# Patient Record
Sex: Male | Born: 1955 | Race: White | Hispanic: No | Marital: Single | Smoking: Never smoker
Health system: Southern US, Community
[De-identification: ages and names within clinical notes are randomized; demographics above are authoritative.]

## PROBLEM LIST (undated history)

## (undated) DIAGNOSIS — E785 Hyperlipidemia, unspecified: Secondary | ICD-10-CM

## (undated) DIAGNOSIS — R030 Elevated blood-pressure reading, without diagnosis of hypertension: Secondary | ICD-10-CM

## (undated) DIAGNOSIS — I214 Non-ST elevation (NSTEMI) myocardial infarction: Secondary | ICD-10-CM

## (undated) HISTORY — PX: TOOTH EXTRACTION: SUR596

## (undated) HISTORY — PX: POLYPECTOMY: SHX149

## (undated) HISTORY — PX: COLONOSCOPY: SHX174

## (undated) HISTORY — DX: Hyperlipidemia, unspecified: E78.5

---

## 1980-11-06 HISTORY — PX: WISDOM TOOTH EXTRACTION: SHX21

## 2002-03-09 ENCOUNTER — Emergency Department (HOSPITAL_COMMUNITY): Admission: EM | Admit: 2002-03-09 | Discharge: 2002-03-10 | Payer: Self-pay | Admitting: *Deleted

## 2006-06-19 ENCOUNTER — Ambulatory Visit: Payer: Self-pay | Admitting: Gastroenterology

## 2006-07-03 ENCOUNTER — Ambulatory Visit: Payer: Self-pay | Admitting: Gastroenterology

## 2011-07-13 ENCOUNTER — Encounter: Payer: Self-pay | Admitting: Internal Medicine

## 2012-01-04 ENCOUNTER — Encounter: Payer: Self-pay | Admitting: Internal Medicine

## 2012-02-15 ENCOUNTER — Encounter: Payer: Self-pay | Admitting: Internal Medicine

## 2012-02-15 ENCOUNTER — Ambulatory Visit (AMBULATORY_SURGERY_CENTER): Payer: BC Managed Care – PPO | Admitting: *Deleted

## 2012-02-15 VITALS — Ht 66.0 in | Wt 125.5 lb

## 2012-02-15 DIAGNOSIS — Z1211 Encounter for screening for malignant neoplasm of colon: Secondary | ICD-10-CM

## 2012-02-15 MED ORDER — PEG-KCL-NACL-NASULF-NA ASC-C 100 G PO SOLR: ORAL | Status: DC

## 2012-02-29 ENCOUNTER — Encounter: Payer: Self-pay | Admitting: Internal Medicine

## 2012-02-29 ENCOUNTER — Ambulatory Visit (AMBULATORY_SURGERY_CENTER): Payer: BC Managed Care – PPO | Admitting: Internal Medicine

## 2012-02-29 VITALS — BP 118/70 | HR 70 | Temp 96.8°F | Resp 20 | Ht 66.0 in | Wt 125.0 lb

## 2012-02-29 DIAGNOSIS — Z1211 Encounter for screening for malignant neoplasm of colon: Secondary | ICD-10-CM

## 2012-02-29 DIAGNOSIS — D126 Benign neoplasm of colon, unspecified: Secondary | ICD-10-CM

## 2012-02-29 DIAGNOSIS — Z86010 Personal history of colonic polyps: Secondary | ICD-10-CM

## 2012-02-29 MED ORDER — SODIUM CHLORIDE 0.9 % IV SOLN: 500.0000 mL | INTRAVENOUS | Status: DC

## 2012-02-29 NOTE — Op Note (Signed)
[facility] [address] [place], [place]  [address]  COLONOSCOPY PROCEDURE REPORT  PATIENT:  Thomas, Kelley  MR#:  [id number] BIRTHDATE:  12-04-1955, 55 yrs. old  GENDER:  male ENDOSCOPIST:  Wilhemina Bonito. Eda Keys, MD REF. BY:  Screening Recall PROCEDURE DATE:  02/29/2012 PROCEDURE:  Colonoscopy with snare polypectomy X 1 ASA CLASS:  Class II INDICATIONS:  history of polyps, surveillance and high-risk screening ; index exam 2003 [facility]) nonadenomatous polyp. f/u 2007 - no polyps. PATIENT CLARIFIED FAMILY HX. NO RELATIVE WITH COLON CANCER MEDICATIONS:   MAC sedation, administered by CRNA, propofol (Diprivan) 250 mg IV  DESCRIPTION OF PROCEDURE:   After the risks benefits and alternatives of the procedure were thoroughly explained, informed consent was obtained.  Digital rectal exam was performed and revealed no abnormalities.   The LB CF-[id number] endoscope was introduced through the anus and advanced to the cecum, which was identified by both the appendix and ileocecal valve, without limitations.  The quality of the prep was excellent, using MoviPrep.  The instrument was then slowly withdrawn as the colon was fully examined. <<PROCEDUREIMAGES>>  FINDINGS: A diminutive polyp was found in the rectum and snared without cautery.Retrieval was successful. Otherwise normal colonoscopy without other polyps, masses, vascular ectasias, or inflammatory changes.   Retroflexed views in the rectum revealed no abnormalities.  The time to cecum = 3:42 minutes. The scope was then withdrawn in 10:05 minutes from the cecum and the procedure completed.  COMPLICATIONS:  None  ENDOSCOPIC IMPRESSION: 1) Diminutive polyp in the rectum - removed 2) Otherwise normal colonoscopy  RECOMMENDATIONS: 1) Repeat colonoscopy in 5 years if polyp adenomatous; otherwise 10 years  ______________________________ Wilhemina Bonito. Eda Keys, MD  CC:  Thomas Apley, MD;  The Patient  n. eSIGNED:   Wilhemina Bonito.  Eda Keys at 02/29/2012 03:00 PM  Jocelyn Lamer, [id number]

## 2012-02-29 NOTE — Progress Notes (Signed)
Propofol administered by S Camp CRNA 

## 2012-02-29 NOTE — Patient Instructions (Signed)
Discharge instructions given with verbal understanding. Handout on polyps given. Resume previous medications. YOU HAD AN ENDOSCOPIC PROCEDURE TODAY AT THE [facility]: Refer to the procedure report that was given to you for any specific questions about what was found during the examination.  If the procedure report does not answer your questions, please call your gastroenterologist to clarify.  If you requested that your care partner not be given the details of your procedure findings, then the procedure report has been included in a sealed envelope for you to review at your convenience later.  YOU SHOULD EXPECT: Some feelings of bloating in the abdomen. Passage of more gas than usual.  Walking can help get rid of the air that was put into your GI tract during the procedure and reduce the bloating. If you had a lower endoscopy (such as a colonoscopy or flexible sigmoidoscopy) you may notice spotting of blood in your stool or on the toilet paper. If you underwent a bowel prep for your procedure, then you may not have a normal bowel movement for a few days.  DIET: Your first meal following the procedure should be a light meal and then it is ok to progress to your normal diet.  A half-sandwich or bowl of soup is an example of a good first meal.  Heavy or fried foods are harder to digest and may make you feel nauseous or bloated.  Likewise meals heavy in dairy and vegetables can cause extra gas to form and this can also increase the bloating.  Drink plenty of fluids but you should avoid alcoholic beverages for 24 hours.  ACTIVITY: Your care partner should take you home directly after the procedure.  You should plan to take it easy, moving slowly for the rest of the day.  You can resume normal activity the day after the procedure however you should NOT DRIVE or use heavy machinery for 24 hours (because of the sedation medicines used during the test).    SYMPTOMS TO REPORT IMMEDIATELY: A  gastroenterologist can be reached at any hour.  During normal business hours, 8:30 AM to 5:00 PM Monday through Friday, call [phone].  After hours and on weekends, please call the GI answering service at [phone] who will take a message and have the physician on call contact you.   Following lower endoscopy (colonoscopy or flexible sigmoidoscopy):  Excessive amounts of blood in the stool  Significant tenderness or worsening of abdominal pains  Swelling of the abdomen that is new, acute  Fever of 100F or higher  FOLLOW UP: If any biopsies were taken you will be contacted by phone or by letter within the next 1-3 weeks.  Call your gastroenterologist if you have not heard about the biopsies in 3 weeks.  Our staff will call the home number listed on your records the next business day following your procedure to check on you and address any questions or concerns that you may have at that time regarding the information given to you following your procedure. This is a courtesy call and so if there is no answer at the home number and we have not heard from you through the emergency physician on call, we will assume that you have returned to your regular daily activities without incident.  SIGNATURES/CONFIDENTIALITY: You and/or your care partner have signed paperwork which will be entered into your electronic medical record.  These signatures attest to the fact that that the information above on your After Visit Summary has   been reviewed and is understood.  Full responsibility of the confidentiality of this discharge information lies with you and/or your care-partner. 

## 2012-02-29 NOTE — Progress Notes (Signed)
Patient did not experience any of the following events: a burn prior to discharge; a fall within the facility; wrong site/side/patient/procedure/implant event; or a hospital transfer or hospital admission upon discharge from the facility. (G8907) Patient did not have preoperative order for IV antibiotic SSI prophylaxis. (G8918)  

## 2012-03-01 ENCOUNTER — Telehealth: Payer: Self-pay

## 2012-03-01 NOTE — Telephone Encounter (Signed)
Left message on answering machine. 

## 2012-03-05 ENCOUNTER — Encounter: Payer: Self-pay | Admitting: Internal Medicine

## 2013-07-21 ENCOUNTER — Ambulatory Visit
Admission: RE | Admit: 2013-07-21 | Discharge: 2013-07-21 | Disposition: A | Discharge status: Home / Self Care | Payer: BC Managed Care – PPO | Source: Ambulatory Visit | Attending: Internal Medicine | Admitting: Internal Medicine

## 2013-07-21 ENCOUNTER — Other Ambulatory Visit: Payer: Self-pay | Admitting: Internal Medicine

## 2013-07-21 DIAGNOSIS — M79644 Pain in right finger(s): Secondary | ICD-10-CM

## 2014-01-15 ENCOUNTER — Ambulatory Visit
Admission: RE | Admit: 2014-01-15 | Discharge: 2014-01-15 | Disposition: A | Discharge status: Home / Self Care | Payer: BC Managed Care – PPO | Source: Ambulatory Visit | Attending: Internal Medicine | Admitting: Internal Medicine

## 2014-01-15 ENCOUNTER — Other Ambulatory Visit: Payer: Self-pay | Admitting: Internal Medicine

## 2014-01-15 DIAGNOSIS — M542 Cervicalgia: Secondary | ICD-10-CM

## 2015-10-19 ENCOUNTER — Encounter: Payer: Self-pay | Admitting: Gastroenterology

## 2016-12-29 ENCOUNTER — Encounter: Payer: Self-pay | Admitting: Internal Medicine

## 2017-08-10 ENCOUNTER — Encounter: Payer: Self-pay | Admitting: Internal Medicine

## 2017-09-21 ENCOUNTER — Other Ambulatory Visit: Payer: Self-pay

## 2017-09-21 ENCOUNTER — Ambulatory Visit (AMBULATORY_SURGERY_CENTER): Payer: Self-pay | Admitting: *Deleted

## 2017-09-21 VITALS — Ht 66.0 in | Wt 122.8 lb

## 2017-09-21 DIAGNOSIS — Z860100 Personal history of colon polyps, unspecified: Secondary | ICD-10-CM

## 2017-09-21 MED ORDER — NA SULFATE-K SULFATE-MG SULF 17.5-3.13-1.6 GM/177ML PO SOLN: 1.0000 [IU] | Freq: Once | ORAL | 0 refills | Status: AC

## 2017-09-21 NOTE — Progress Notes (Signed)
No egg or soy allergy known to patient  No issues with past sedation with any surgeries  or procedures, no intubation problems  No diet pills per patient No home 02 use per patient  No blood thinners per patient  Pt denies issues with constipation  No A fib or A flutter  EMMI video sent to pt's e mail  

## 2017-10-12 ENCOUNTER — Other Ambulatory Visit: Payer: Self-pay

## 2017-10-12 ENCOUNTER — Encounter: Payer: Self-pay | Admitting: Internal Medicine

## 2017-10-12 ENCOUNTER — Ambulatory Visit (AMBULATORY_SURGERY_CENTER): Payer: BLUE CROSS/BLUE SHIELD | Admitting: Internal Medicine

## 2017-10-12 VITALS — BP 99/60 | HR 58 | Temp 97.3°F | Resp 11 | Ht 66.0 in | Wt 122.0 lb

## 2017-10-12 DIAGNOSIS — Z86010 Personal history of colonic polyps: Secondary | ICD-10-CM

## 2017-10-12 MED ORDER — SODIUM CHLORIDE 0.9 % IV SOLN
500.0000 mL | Freq: Once | INTRAVENOUS | Status: DC
Start: 2017-10-12 — End: 2017-10-12

## 2017-10-12 NOTE — Patient Instructions (Signed)
Impression/Recommendations:  Diverticulosis handout given to patient. Hemorrhoid handout given to patient.  Repeat colonoscopy in 10 years for surveillance.  Resume previous diet. Continue present medications.  YOU HAD AN ENDOSCOPIC PROCEDURE TODAY AT [facility]:   Refer to the procedure report that was given to you for any specific questions about what was found during the examination.  If the procedure report does not answer your questions, please call your gastroenterologist to clarify.  If you requested that your care partner not be given the details of your procedure findings, then the procedure report has been included in a sealed envelope for you to review at your convenience later.  YOU SHOULD EXPECT: Some feelings of bloating in the abdomen. Passage of more gas than usual.  Walking can help get rid of the air that was put into your GI tract during the procedure and reduce the bloating. If you had a lower endoscopy (such as a colonoscopy or flexible sigmoidoscopy) you may notice spotting of blood in your stool or on the toilet paper. If you underwent a bowel prep for your procedure, you may not have a normal bowel movement for a few days.  Please Note:  You might notice some irritation and congestion in your nose or some drainage.  This is from the oxygen used during your procedure.  There is no need for concern and it should clear up in a day or so.  SYMPTOMS TO REPORT IMMEDIATELY:   Following lower endoscopy (colonoscopy or flexible sigmoidoscopy):  Excessive amounts of blood in the stool  Significant tenderness or worsening of abdominal pains  Swelling of the abdomen that is new, acute  Fever of 100F or higher For urgent or emergent issues, a gastroenterologist can be reached at any hour by calling [phone].   DIET:  We do recommend a small meal at first, but then you may proceed to your regular diet.  Drink plenty of fluids but you should avoid  alcoholic beverages for 24 hours.  ACTIVITY:  You should plan to take it easy for the rest of today and you should NOT DRIVE or use heavy machinery until tomorrow (because of the sedation medicines used during the test).    FOLLOW UP: Our staff will call the number listed on your records the next business day following your procedure to check on you and address any questions or concerns that you may have regarding the information given to you following your procedure. If we do not reach you, we will leave a message.  However, if you are feeling well and you are not experiencing any problems, there is no need to return our call.  We will assume that you have returned to your regular daily activities without incident.  If any biopsies were taken you will be contacted by phone or by letter within the next 1-3 weeks.  Please call us at [phone] if you have not heard about the biopsies in 3 weeks.    SIGNATURES/CONFIDENTIALITY: You and/or your care partner have signed paperwork which will be entered into your electronic medical record.  These signatures attest to the fact that that the information above on your After Visit Summary has been reviewed and is understood.  Full responsibility of the confidentiality of this discharge information lies with you and/or your care-partner.

## 2017-10-12 NOTE — Progress Notes (Signed)
Pt's states no medical or surgical changes since previsit or office visit. 

## 2017-10-12 NOTE — Op Note (Signed)
[place] [facility] Patient Name: Thomas Kelley Procedure Date: 10/12/2017 7:12 AM MRN: [id number] Endoscopist: Docia Chuck. Henrene Pastor , MD Age: 61 Referring MD:  Date of Birth: [birth year] Gender: Male Account #: [id number] Procedure:                Colonoscopy Indications:              High risk colon cancer surveillance: Personal                            history of non-advanced adenoma. Previous                            examinations 2003, 2007, 2013 Medicines:                Monitored Anesthesia Care Procedure:                Pre-Anesthesia Assessment:                           - Prior to the procedure, a History and Physical                            was performed, and patient medications and                            allergies were reviewed. The patient's tolerance of                            previous anesthesia was also reviewed. The risks                            and benefits of the procedure and the sedation                            options and risks were discussed with the patient.                            All questions were answered, and informed consent                            was obtained. Prior Anticoagulants: The patient has                            taken no previous anticoagulant or antiplatelet                            agents. ASA Grade Assessment: I - A normal, healthy                            patient. After reviewing the risks and benefits,                            the patient was deemed in satisfactory condition to  undergo the procedure.                           After obtaining informed consent, the colonoscope                            was passed under direct vision. Throughout the                            procedure, the patient's blood pressure, pulse, and                            oxygen saturations were monitored continuously. The                            Colonoscope was introduced through the anus and                  advanced to the the cecum, identified by                            appendiceal orifice and ileocecal valve. The                            ileocecal valve, appendiceal orifice, and rectum                            were photographed. The quality of the bowel                            preparation was excellent. The colonoscopy was                            performed without difficulty. The patient tolerated                            the procedure well. The bowel preparation used was                            SUPREP. Scope In: 8:19:00 AM Scope Out: 8:35:42 AM Scope Withdrawal Time: 0 hours 12 minutes 3 seconds  Total Procedure Duration: 0 hours 16 minutes 42 seconds  Findings:                 A few small-mouthed diverticula were found in the                            sigmoid colon.                           Internal hemorrhoids were found during retroflexion.                           The exam was otherwise without abnormality on                            direct and retroflexion  views. Complications:            No immediate complications. Estimated blood loss:                            None. Estimated Blood Loss:     Estimated blood loss: none. Impression:               - Diverticulosis in the sigmoid colon.                           - Internal hemorrhoids.                           - The examination was otherwise normal on direct                            and retroflexion views.                           - No specimens collected. Recommendation:           - Repeat colonoscopy in 10 years for surveillance.                           - Patient has a contact number available for                            emergencies. The signs and symptoms of potential                            delayed complications were discussed with the                            patient. Return to normal activities tomorrow.                            Written discharge instructions were provided  to the                            patient.                           - Resume previous diet.                           - Continue present medications. Docia Chuck. Henrene Pastor, MD 10/12/2017 8:44:29 AM This report has been signed electronically.

## 2017-10-12 NOTE — Progress Notes (Signed)
A/ox3 pleased with MAC, report to RN 

## 2017-10-17 ENCOUNTER — Telehealth: Payer: Self-pay

## 2017-10-17 NOTE — Telephone Encounter (Signed)
  Follow up Call-  Call back number 10/12/2017  Post procedure Call Back phone  # [phone]  Permission to leave phone message Yes  Some recent data might be hidden     Patient questions:  Do you have a fever, pain , or abdominal swelling? No. Pain Score  0 *  Have you tolerated food without any problems? Yes.    Have you been able to return to your normal activities? Yes.    Do you have any questions about your discharge instructions: Diet   No. Medications  No. Follow up visit  No.  Do you have questions or concerns about your Care? No.  Actions: * If pain score is 4 or above: No action needed, pain <4.

## 2019-01-06 ENCOUNTER — Encounter (HOSPITAL_COMMUNITY): Payer: Self-pay | Admitting: *Deleted

## 2019-01-06 ENCOUNTER — Inpatient Hospital Stay (HOSPITAL_COMMUNITY)
Admission: EM | Admit: 2019-01-06 | Discharge: 2019-01-08 | DRG: 282 | Disposition: A | Discharge status: Home / Self Care | Payer: BLUE CROSS/BLUE SHIELD | Source: Ambulatory Visit | Attending: Cardiology | Admitting: Cardiology

## 2019-01-06 ENCOUNTER — Emergency Department (HOSPITAL_COMMUNITY): Payer: BLUE CROSS/BLUE SHIELD

## 2019-01-06 ENCOUNTER — Other Ambulatory Visit: Payer: Self-pay

## 2019-01-06 DIAGNOSIS — E785 Hyperlipidemia, unspecified: Secondary | ICD-10-CM | POA: Diagnosis present

## 2019-01-06 DIAGNOSIS — R079 Chest pain, unspecified: Secondary | ICD-10-CM

## 2019-01-06 DIAGNOSIS — I214 Non-ST elevation (NSTEMI) myocardial infarction: Secondary | ICD-10-CM

## 2019-01-06 DIAGNOSIS — R030 Elevated blood-pressure reading, without diagnosis of hypertension: Secondary | ICD-10-CM

## 2019-01-06 DIAGNOSIS — R51 Headache: Secondary | ICD-10-CM | POA: Diagnosis present

## 2019-01-06 DIAGNOSIS — I201 Angina pectoris with documented spasm: Secondary | ICD-10-CM

## 2019-01-06 DIAGNOSIS — R110 Nausea: Secondary | ICD-10-CM | POA: Diagnosis present

## 2019-01-06 DIAGNOSIS — R519 Headache, unspecified: Secondary | ICD-10-CM

## 2019-01-06 HISTORY — DX: Non-ST elevation (NSTEMI) myocardial infarction: I21.4

## 2019-01-06 HISTORY — DX: Elevated blood-pressure reading, without diagnosis of hypertension: R03.0

## 2019-01-06 LAB — BASIC METABOLIC PANEL
Anion gap: 8 (ref 5–15)
BUN: 12 mg/dL (ref 8–23)
CO2: 25 mmol/L (ref 22–32)
CREATININE: 0.8 mg/dL (ref 0.61–1.24)
Calcium: 9.1 mg/dL (ref 8.9–10.3)
Chloride: 106 mmol/L (ref 98–111)
GFR calc Af Amer: >60 mL/min (ref >60)
GFR calc non Af Amer: >60 mL/min (ref >60)
Glucose, Bld: 116 mg/dL — ABNORMAL HIGH (ref 70–99)
Potassium: 3.8 mmol/L (ref 3.5–5.1)
Sodium: 139 mmol/L (ref 135–145)

## 2019-01-06 LAB — CBC WITH DIFFERENTIAL/PLATELET
ABS IMMATURE GRANULOCYTES: 0.02 10*3/uL (ref 0.00–0.07)
Basophils Absolute: 0.1 10*3/uL (ref 0.0–0.1)
Basophils Relative: 1 %
Eosinophils Absolute: 0.1 10*3/uL (ref 0.0–0.5)
Eosinophils Relative: 1 %
HCT: 42.2 % (ref 39.0–52.0)
HEMOGLOBIN: 14.4 g/dL (ref 13.0–17.0)
Immature Granulocytes: 0 %
Lymphocytes Relative: 13 %
Lymphs Abs: 1.2 10*3/uL (ref 0.7–4.0)
MCH: 32.4 pg (ref 26.0–34.0)
MCHC: 34.1 g/dL (ref 30.0–36.0)
MCV: 95 fL (ref 80.0–100.0)
MONO ABS: 0.7 10*3/uL (ref 0.1–1.0)
Monocytes Relative: 8 %
NEUTROS ABS: 7.4 10*3/uL (ref 1.7–7.7)
Neutrophils Relative %: 77 %
Platelets: 198 10*3/uL (ref 150–400)
RBC: 4.44 MIL/uL (ref 4.22–5.81)
RDW: 12.4 % (ref 11.5–15.5)
WBC: 9.6 10*3/uL (ref 4.0–10.5)
nRBC: 0 % (ref 0.0–0.2)

## 2019-01-06 LAB — I-STAT TROPONIN, ED: Troponin i, poc: 1.05 ng/mL (ref 0.00–0.08)

## 2019-01-06 LAB — APTT: aPTT: 29 seconds (ref 24–36)

## 2019-01-06 MED ORDER — SODIUM CHLORIDE 0.9 % IV SOLN
INTRAVENOUS | Status: DC
  Administered 2019-01-07: 10 mL/h via INTRAVENOUS

## 2019-01-06 MED ORDER — ROSUVASTATIN CALCIUM 20 MG PO TABS
40.0000 mg | ORAL_TABLET | Freq: Every day | ORAL | Status: DC
  Administered 2019-01-07: 40 mg via ORAL
  Filled 2019-01-06: qty 2

## 2019-01-06 MED ORDER — HEPARIN BOLUS VIA INFUSION
3250.0000 [IU] | Freq: Once | INTRAVENOUS | Status: AC
  Administered 2019-01-06: 3250 [IU] via INTRAVENOUS
  Filled 2019-01-06: qty 3250

## 2019-01-06 MED ORDER — ASPIRIN EC 81 MG PO TBEC
81.0000 mg | DELAYED_RELEASE_TABLET | Freq: Every day | ORAL | Status: DC
  Administered 2019-01-08: 81 mg via ORAL
  Filled 2019-01-06: qty 1

## 2019-01-06 MED ORDER — HEPARIN (PORCINE) 25000 UT/250ML-% IV SOLN
650.0000 [IU]/h | INTRAVENOUS | Status: DC
  Administered 2019-01-06: 650 [IU]/h via INTRAVENOUS
  Filled 2019-01-06: qty 250

## 2019-01-06 MED ORDER — ACETAMINOPHEN 325 MG PO TABS
650.0000 mg | ORAL_TABLET | ORAL | Status: DC | PRN
  Administered 2019-01-07: 650 mg via ORAL
  Filled 2019-01-06: qty 2

## 2019-01-06 MED ORDER — ONDANSETRON HCL 4 MG/2ML IJ SOLN
4.0000 mg | Freq: Four times a day (QID) | INTRAMUSCULAR | Status: DC | PRN
  Administered 2019-01-08: 4 mg via INTRAVENOUS
  Filled 2019-01-06: qty 2

## 2019-01-06 MED ORDER — LISINOPRIL 5 MG PO TABS: 5.0000 mg | ORAL_TABLET | Freq: Every day | ORAL | Status: DC

## 2019-01-06 MED ORDER — NITROGLYCERIN 0.4 MG SL SUBL: 0.4000 mg | SUBLINGUAL_TABLET | SUBLINGUAL | Status: DC | PRN

## 2019-01-06 MED ORDER — METOPROLOL SUCCINATE ER 25 MG PO TB24
12.5000 mg | ORAL_TABLET | Freq: Every day | ORAL | Status: DC
  Administered 2019-01-07 – 2019-01-08 (×2): 12.5 mg via ORAL
  Filled 2019-01-06 (×2): qty 1

## 2019-01-06 MED ORDER — ASPIRIN 81 MG PO CHEW
324.0000 mg | CHEWABLE_TABLET | Freq: Once | ORAL | Status: AC
  Administered 2019-01-06: 324 mg via ORAL
  Filled 2019-01-06: qty 4

## 2019-01-06 NOTE — ED Notes (Signed)
1st attempt to call report to 6E, RN in patient room and will have to call back.

## 2019-01-06 NOTE — ED Provider Notes (Signed)
[facility] EMERGENCY DEPARTMENT Provider Note   CSN: [id number] Arrival date & time: 01/06/19  1823    History   Chief Complaint Chief Complaint  Patient presents with  . Chest Pain  . Abnormal Lab    HPI Thomas Kelley is a 63 y.o. male.     63 year old male who presents with recurrent chest pain characterizes heaviness and pressure in the pelvis chest with radiation to his left arm.  Symptoms can be at rest or with exertion.  Has had some dyspnea but no diaphoresis with it.  Symptoms last for approximately an hour.  Went to see his doctor today and blood work was performed which showed an elevated troponin.  Patient has no prior cardiac history.  Was sent here for further management.  Does have a history of hyperlipidemia.     Past Medical History:  Diagnosis Date  . Hyperlipidemia    borderline    There are no active problems to display for this patient.   Past Surgical History:  Procedure Laterality Date  . COLONOSCOPY    . POLYPECTOMY    . TOOTH EXTRACTION     oral surgeon  . WISDOM TOOTH EXTRACTION  1982        Home Medications    Prior to Admission medications   Medication Sig Start Date End Date Taking? Authorizing Provider  Ascorbic Acid (VITAMIN C) 1000 MG tablet Take 1,000 mg daily by mouth.    [provider]    Family History Family History  Problem Relation Age of Onset  . Stomach cancer Father 2  . Colon cancer Neg Hx   . Colon polyps Neg Hx   . Esophageal cancer Neg Hx   . Rectal cancer Neg Hx     Social History Social History   Tobacco Use  . Smoking status: Never Smoker  . Smokeless tobacco: Never Used  Substance Use Topics  . Alcohol use: Yes    Comment: rare  . Drug use: No     Allergies   Patient has no known allergies.   Review of Systems Review of Systems  All other systems reviewed and are negative.    Physical Exam Updated Vital Signs BP (!) 147/93 (BP Location: Right Arm)    Pulse 97   Temp 97.8 F (36.6 C) (Oral)   Resp 18   SpO2 100%   Physical Exam Vitals signs and nursing note reviewed.  Constitutional:      General: He is not in acute distress.    Appearance: Normal appearance. He is well-developed. He is not toxic-appearing.  HENT:     Head: Normocephalic and atraumatic.  Eyes:     General: Lids are normal.     Conjunctiva/sclera: Conjunctivae normal.     Pupils: Pupils are equal, round, and reactive to light.  Neck:     Musculoskeletal: Normal range of motion and neck supple.     Thyroid: No thyroid mass.     Trachea: No tracheal deviation.  Cardiovascular:     Rate and Rhythm: Normal rate and regular rhythm.     Heart sounds: Normal heart sounds. No murmur. No gallop.   Pulmonary:     Effort: Pulmonary effort is normal. No respiratory distress.     Breath sounds: Normal breath sounds. No stridor. No decreased breath sounds, wheezing, rhonchi or rales.  Abdominal:     General: Bowel sounds are normal. There is no distension.     Palpations: Abdomen is soft.  Tenderness: There is no abdominal tenderness. There is no rebound.  Musculoskeletal: Normal range of motion.        General: No tenderness.  Skin:    General: Skin is warm and dry.     Findings: No abrasion or rash.  Neurological:     Mental Status: He is alert and oriented to person, place, and time.     GCS: GCS eye subscore is 4. GCS verbal subscore is 5. GCS motor subscore is 6.     Cranial Nerves: No cranial nerve deficit.     Sensory: No sensory deficit.  Psychiatric:        Speech: Speech normal.        Behavior: Behavior normal.      ED Treatments / Results  Labs (all labs ordered are listed, but only abnormal results are displayed) Labs Reviewed  CBC WITH DIFFERENTIAL/PLATELET  BASIC METABOLIC PANEL  I-STAT TROPONIN, ED    EKG EKG Interpretation  Date/Time:  Monday January 06 2019 18:47:51 EST Ventricular Rate:  89 PR Interval:    QRS Duration: 97 QT  Interval:  360 QTC Calculation: 438 R Axis:   43 Text Interpretation:  Sinus rhythm Right atrial enlargement Confirmed by Lacretia Leigh ([id number]) on 01/06/2019 6:50:30 PM   Radiology No results found.  Procedures Procedures (including critical care time)  Medications Ordered in ED Medications  0.9 %  sodium chloride infusion (has no administration in time range)     Initial Impression / Assessment and Plan / ED Course  I have reviewed the triage vital signs and the nursing notes.  Pertinent labs & imaging results that were available during my care of the patient were reviewed by me and considered in my medical decision making (see chart for details).        Patient troponin elevated here at 1.05.  Was given full dose aspirin here will also heparinize.  His EKG shows no acute ischemic changes.  He is not having chest pain at this time.  Will consult cardiology for admission   CRITICAL CARE Performed by: Leota Jacobsen Total critical care time: 45 minutes Critical care time was exclusive of separately billable procedures and treating other patients. Critical care was necessary to treat or prevent imminent or life-threatening deterioration. Critical care was time spent personally by me on the following activities: development of treatment plan with patient and/or surrogate as well as nursing, discussions with consultants, evaluation of patient's response to treatment, examination of patient, obtaining history from patient or surrogate, ordering and performing treatments and interventions, ordering and review of laboratory studies, ordering and review of radiographic studies, pulse oximetry and re-evaluation of patient's condition.  Final Clinical Impressions(s) / ED Diagnoses   Final diagnoses:  Chest pain    ED Discharge Orders    None       Lacretia Leigh, MD 01/06/19 2113

## 2019-01-06 NOTE — ED Notes (Signed)
Report called to [place] RN

## 2019-01-06 NOTE — H&P (Signed)
Cardiology History & Physical    Patient ID: Thomas Kelley MRN: [id number], DOB: [birth year] Date of Encounter: 01/06/2019, 10:49 PM Primary Physician: Lorene Dy, MD  Chief Complaint: Chest pain   HPI: Thomas Kelley is a 63 y.o. male with history of HLD who presents with CP.  Pt was shopping at [employer] yesterday when he first noted substernal CP with radiation to the arms.  In all the episode lasted about an hour and improved after resting.  He went on to have another 5-6 episodes of chest pain over the next 24 hours.  He went to his PCP's office for evaluation, where his troponin was drawn and was elevated.  He presented to the [facility] ED for further management.  His ECG showed NSR without acute changes.  Labs showed normal CBC and BMP, and troponin of 1.05.    Full dose ASA was given and IV heparin was started.  He remained pain free throughout his ED stay.  Past Medical History:  Diagnosis Date  . Hyperlipidemia    borderline     Surgical History:  Past Surgical History:  Procedure Laterality Date  . COLONOSCOPY    . POLYPECTOMY    . TOOTH EXTRACTION     oral surgeon  . WISDOM TOOTH EXTRACTION  1982     Home Meds: Prior to Admission medications   Medication Sig Start Date End Date Taking? Authorizing Provider  Ascorbic Acid (VITAMIN C) 1000 MG tablet Take 1,000 mg daily by mouth.   Yes [provider]  Aspirin-Acetaminophen-Caffeine (EXCEDRIN EXTRA STRENGTH PO) Take 2 tablets by mouth daily as needed (headache).   Yes [provider]  ibuprofen (ADVIL,MOTRIN) 200 MG tablet Take 800 mg by mouth 2 (two) times daily as needed (back pain/spasms).   Yes [provider]  rosuvastatin (CRESTOR) 10 MG tablet Take 10 mg by mouth daily. 01/06/19  Yes [provider]    Allergies: No Known Allergies  Social History   Socioeconomic History  . Marital status: Single    Spouse name: Not on file  . Number of children: Not on file  . Years of education:  Not on file  . Highest education level: Not on file  Occupational History  . Not on file  Social Needs  . Financial resource strain: Not on file  . Food insecurity:    Worry: Not on file    Inability: Not on file  . Transportation needs:    Medical: Not on file    Non-medical: Not on file  Tobacco Use  . Smoking status: Never Smoker  . Smokeless tobacco: Never Used  Substance and Sexual Activity  . Alcohol use: Yes    Comment: rare  . Drug use: No  . Sexual activity: Not on file  Lifestyle  . Physical activity:    Days per week: Not on file    Minutes per session: Not on file  . Stress: Not on file  Relationships  . Social connections:    Talks on phone: Not on file    Gets together: Not on file    Attends religious service: Not on file    Active member of club or organization: Not on file    Attends meetings of clubs or organizations: Not on file    Relationship status: Not on file  . Intimate partner violence:    Fear of current or ex partner: Not on file    Emotionally abused: Not on file  Physically abused: Not on file    Forced sexual activity: Not on file  Other Topics Concern  . Not on file  Social History Narrative  . Not on file     Family History  Problem Relation Age of Onset  . Stomach cancer Father 8  . Colon cancer Neg Hx   . Colon polyps Neg Hx   . Esophageal cancer Neg Hx   . Rectal cancer Neg Hx     Review of Systems: All other systems reviewed and are otherwise negative except as noted above.  Labs:   Lab Results  Component Value Date   WBC 9.6 01/06/2019   HGB 14.4 01/06/2019   HCT 42.2 01/06/2019   MCV 95.0 01/06/2019   PLT 198 01/06/2019    Recent Labs  Lab 01/06/19 2045  NA 139  K 3.8  CL 106  CO2 25  BUN 12  CREATININE 0.80  CALCIUM 9.1  GLUCOSE 116*   No results for input(s): CKTOTAL, CKMB, TROPONINI in the last 72 hours. No results found for: CHOL, HDL, LDLCALC, TRIG No results found for:  DDIMER  Radiology/Studies:  Dg Chest 2 View  Result Date: 01/06/2019 CLINICAL DATA:  Chest pain beginning yesterday. EXAM: CHEST - 2 VIEW COMPARISON:  None. FINDINGS: Heart size is normal. There is no edema or effusion. The lungs are hyperinflated. No focal airspace disease is present. The visualized soft tissues and bony thorax are unremarkable. IMPRESSION: 1. No acute cardiopulmonary disease. 2. Hyperinflation of both lungs without focal airspace disease. Electronically Signed   By: San Morelle M.D.   On: 01/06/2019 19:28   Wt Readings from Last 3 Encounters:  01/06/19 54.4 kg  10/12/17 55.3 kg  09/21/17 55.7 kg    EKG: NSR, no acute ST-TW changes.  Physical Exam: Blood pressure (!) 146/95, pulse 79, temperature 97.8 F (36.6 C), temperature source Oral, resp. rate 18, height 5\' 6"  (1.676 m), weight 54.4 kg, SpO2 100 %. Body mass index is 19.37 kg/m. General: Well developed, well nourished, in no acute distress. Head: Normocephalic, atraumatic, sclera non-icteric, no xanthomas, nares are without discharge.  Neck: Negative for carotid bruits. JVD not elevated. Lungs: Clear bilaterally to auscultation without wheezes, rales, or rhonchi. Breathing is unlabored. Heart: RRR with S1 S2. No murmurs, rubs, or gallops appreciated. Abdomen: Soft, non-tender, non-distended with normoactive bowel sounds. No hepatomegaly. No rebound/guarding. No obvious abdominal masses. Msk:  Strength and tone appear normal for age. Extremities: No clubbing or cyanosis. No edema.  Distal pedal pulses are 2+ and equal bilaterally. Neuro: Alert and oriented X 3. No focal deficit. No facial asymmetry. Moves all extremities spontaneously. Psych:  Responds to questions appropriately with a normal affect.    Assessment and Plan  51M with HLD (just started on statin today) who presents with CP, found to have NSTEMI.  1.  NSTEMI: Pain free at present.  Continue IV heparin, low dose ASA, and plan for cardiac  catheterization in the AM.  Will start on low dose lisinopril and metoprolol.  Echocardiogram ordered.  2. HLD: Increase Crestor to 40 mg QD in setting of ACS.  Signed, Doylene Canning, MD 01/06/2019, 10:49 PM

## 2019-01-06 NOTE — ED Triage Notes (Signed)
Pt here after PCP called him to come to ED for evlevated troponin.  Pt A&Ox4 and having intermittent chest pain and pressure.

## 2019-01-06 NOTE — Progress Notes (Signed)
ANTICOAGULATION CONSULT NOTE - Initial Consult  Pharmacy Consult for heparin Indication: chest pain/ACS  No Known Allergies  Patient Measurements: Height: 5\' 6"  (167.6 cm) Weight: 120 lb (54.4 kg) IBW/kg (Calculated) : 63.8 Heparin Dosing Weight: 54.4  Vital Signs: Temp: 97.8 F (36.6 C) (03/02 1841) Temp Source: Oral (03/02 1841) BP: 147/79 (03/02 1900) Pulse Rate: 80 (03/02 1900)  Labs: Recent Labs    01/06/19 2045  HGB 14.4  HCT 42.2  PLT 198    CrCl cannot be calculated (No successful lab value found.).   Medical History: Past Medical History:  Diagnosis Date  . Hyperlipidemia    borderline    Medications:  Scheduled:  . heparin  3,250 Units Intravenous Once   Infusions:  . sodium chloride    . heparin      Assessment: 57 yoM admitted 01/06/19 with chest pain. On admission patient's troponin is elevated at 1.05. Patient not on anticoagulants PTA. Patient's baseline CBC stable.   Goal of Therapy:  Heparin level 0.3-0.7 units/ml Monitor platelets by anticoagulation protocol: Yes   Plan:  Give 3250 units bolus x 1 Start heparin infusion at 650 units/hr Check anti-Xa level in 6 hours and daily while on heparin Continue to monitor H&H and platelets  Thank you for allowing pharmacy to be a part of this patient's care.  Leron Croak, PharmD PGY1 Pharmacy Resident Please check AMION for all [facility] Pharmacy phone numbers 01/06/2019,9:17 PM

## 2019-01-06 NOTE — ED Notes (Signed)
ED TO INPATIENT HANDOFF REPORT  ED Nurse Name and Phone #: Eddie Candle [phone]  S Name/Age/Gender Thomas Kelley 63 y.o. male Room/Bed: 017C/017C  Code Status   Code Status: Full Code  Home/SNF/Other Home Patient oriented to: self, place, time and situation Is this baseline? Yes   Triage Complete: Triage complete  Chief Complaint Angina  Triage Note Pt here after PCP called him to come to ED for evlevated troponin.  Pt A&Ox4 and having intermittent chest pain and pressure.    Allergies No Known Allergies  Level of Care/Admitting Diagnosis ED Disposition    ED Disposition Condition Comment   Admit  Hospital Area: [place] [[id number]]  Level of Care: Cardiac Telemetry [103]  Diagnosis: NSTEMI (non-ST elevated myocardial infarction) [facility]) [081448]  Admitting Physician: Satira Sark [[id number]]  Attending Physician: MCDOWELL, Aloha Gell [[id number]]  PT Class (Do Not Modify): Observation [104]  PT Acc Code (Do Not Modify): Observation [10022]       B Medical/Surgery History Past Medical History:  Diagnosis Date  . Hyperlipidemia    borderline   Past Surgical History:  Procedure Laterality Date  . COLONOSCOPY    . POLYPECTOMY    . TOOTH EXTRACTION     oral surgeon  . WISDOM TOOTH EXTRACTION  1982     A IV Location/Drains/Wounds Patient Lines/Drains/Airways Status   Active Line/Drains/Airways    Name:   Placement date:   Placement time:   Site:   Days:   Peripheral IV 01/06/19 Left Antecubital   01/06/19    2120    Antecubital   less than 1          Intake/Output Last 24 hours No intake or output data in the 24 hours ending 01/06/19 2210  Labs/Imaging Results for orders placed or performed during the hospital encounter of 01/06/19 (from the past 48 hour(s))  CBC with Differential/Platelet     Status: None   Collection Time: 01/06/19  8:45 PM  Result Value Ref Range   WBC 9.6 4.0 - 10.5 K/uL   RBC 4.44 4.22 - 5.81 MIL/uL   Hemoglobin 14.4 13.0 - 17.0 g/dL   HCT 42.2 39.0 - 52.0 %   MCV 95.0 80.0 - 100.0 fL   MCH 32.4 26.0 - 34.0 pg   MCHC 34.1 30.0 - 36.0 g/dL   RDW 12.4 11.5 - 15.5 %   Platelets 198 150 - 400 K/uL   nRBC 0.0 0.0 - 0.2 %   Neutrophils Relative % 77 %   Neutro Abs 7.4 1.7 - 7.7 K/uL   Lymphocytes Relative 13 %   Lymphs Abs 1.2 0.7 - 4.0 K/uL   Monocytes Relative 8 %   Monocytes Absolute 0.7 0.1 - 1.0 K/uL   Eosinophils Relative 1 %   Eosinophils Absolute 0.1 0.0 - 0.5 K/uL   Basophils Relative 1 %   Basophils Absolute 0.1 0.0 - 0.1 K/uL   Immature Granulocytes 0 %   Abs Immature Granulocytes 0.02 0.00 - 0.07 K/uL    Comment: Performed at [facility], [address], [place], [place] [address]  Basic metabolic panel     Status: Abnormal   Collection Time: 01/06/19  8:45 PM  Result Value Ref Range   Sodium 139 135 - 145 mmol/L   Potassium 3.8 3.5 - 5.1 mmol/L   Chloride 106 98 - 111 mmol/L   CO2 25 22 - 32 mmol/L   Glucose, Bld 116 (H) 70 - 99 mg/dL  BUN 12 8 - 23 mg/dL   Creatinine, Ser 0.80 0.61 - 1.24 mg/dL   Calcium 9.1 8.9 - 10.3 mg/dL   GFR calc non Af Amer >60 >60 mL/min   GFR calc Af Amer >60 >60 mL/min   Anion gap 8 5 - 15    Comment: Performed at [facility] [address] [place], [place] [address]  I-stat troponin, ED     Status: Abnormal   Collection Time: 01/06/19  8:52 PM  Result Value Ref Range   Troponin i, poc 1.05 (HH) 0.00 - 0.08 ng/mL   Comment NOTIFIED PHYSICIAN    Comment 3            Comment: Due to the release kinetics of cTnI, a negative result within the first hours of the onset of symptoms does not rule out myocardial infarction with certainty. If myocardial infarction is still suspected, repeat the test at appropriate intervals.   APTT     Status: None   Collection Time: 01/06/19  9:05 PM  Result Value Ref Range   aPTT 29 24 - 36 seconds    Comment: Performed at [facility], [place] [address], [place], [place] [address]    Dg Chest 2 View  Result Date: 01/06/2019 CLINICAL DATA:  Chest pain beginning yesterday. EXAM: CHEST - 2 VIEW COMPARISON:  None. FINDINGS: Heart size is normal. There is no edema or effusion. The lungs are hyperinflated. No focal airspace disease is present. The visualized soft tissues and bony thorax are unremarkable. IMPRESSION: 1. No acute cardiopulmonary disease. 2. Hyperinflation of both lungs without focal airspace disease. Electronically Signed   By: San Morelle M.D.   On: 01/06/2019 19:28    Pending Labs Unresulted Labs (From admission, onward)    Start     Ordered   01/08/19 0500  Heparin level (unfractionated)  Daily,   R     01/06/19 2123   01/07/19 9935  Basic metabolic panel  Tomorrow morning,   R     01/06/19 2206   01/07/19 0500  Lipid panel  Tomorrow morning,   R     01/06/19 2206   01/07/19 0500  CBC  Tomorrow morning,   R     01/06/19 2206   01/07/19 0500  Hemoglobin A1c  Tomorrow morning,   R     01/06/19 2206   01/07/19 0500  Troponin I - Tomorrow AM 0500  Tomorrow morning,   STAT     01/06/19 2206   01/07/19 0500  CBC  Daily,   R     01/06/19 2209   01/07/19 0330  Heparin level (unfractionated)  Once-Timed,   R     01/06/19 2123   01/06/19 2204  HIV antibody (Routine Testing)  Once,   R     01/06/19 2205          Vitals/Pain Today's Vitals   01/06/19 2100 01/06/19 2115 01/06/19 2122 01/06/19 2202  BP: (!) 144/93 (!) 146/95    Pulse: 76 79    Resp:      Temp:      TempSrc:      SpO2: 100% 100%    Weight:      Height:      PainSc:   0-No pain 0-No pain    Isolation Precautions No active isolations  Medications Medications  0.9 %  sodium chloride infusion (has no administration in time range)  heparin ADULT infusion 100 units/mL (25000 units/249mL sodium  chloride 0.45%) (650 Units/hr Intravenous New Bag/Given 01/06/19 2131)  aspirin EC tablet 81 mg (has no administration in time range)  nitroGLYCERIN (NITROSTAT) SL tablet 0.4 mg (has no  administration in time range)  acetaminophen (TYLENOL) tablet 650 mg (has no administration in time range)  ondansetron (ZOFRAN) injection 4 mg (has no administration in time range)  rosuvastatin (CRESTOR) tablet 40 mg (has no administration in time range)  aspirin chewable tablet 324 mg (324 mg Oral Given 01/06/19 2038)  heparin bolus via infusion 3,250 Units (3,250 Units Intravenous Bolus from Bag 01/06/19 2129)    Mobility walks Low fall risk   Focused Assessments Cardiac Assessment Handoff:    No results found for: CKTOTAL, CKMB, CKMBINDEX, TROPONINI No results found for: DDIMER Does the Patient currently have chest pain? No      R Recommendations: See Admitting Provider Note  Report given to:   Additional Notes: Pt went to PCP this morning after experiencing intermittent CP/pressure.  No previous pertinent history. Trop elevated in office, sent to ER for further work up.  Has been CP free since here.  A/O x 4, independent.

## 2019-01-07 ENCOUNTER — Observation Stay (HOSPITAL_COMMUNITY): Payer: BLUE CROSS/BLUE SHIELD

## 2019-01-07 ENCOUNTER — Encounter (HOSPITAL_COMMUNITY): Payer: Self-pay | Admitting: *Deleted

## 2019-01-07 ENCOUNTER — Encounter (HOSPITAL_COMMUNITY)
Admission: EM | Disposition: A | Discharge status: Home / Self Care | Payer: Self-pay | Source: Ambulatory Visit | Attending: Cardiology

## 2019-01-07 DIAGNOSIS — R110 Nausea: Secondary | ICD-10-CM | POA: Diagnosis present

## 2019-01-07 DIAGNOSIS — I201 Angina pectoris with documented spasm: Secondary | ICD-10-CM | POA: Diagnosis present

## 2019-01-07 DIAGNOSIS — I361 Nonrheumatic tricuspid (valve) insufficiency: Secondary | ICD-10-CM | POA: Diagnosis not present

## 2019-01-07 DIAGNOSIS — E785 Hyperlipidemia, unspecified: Secondary | ICD-10-CM | POA: Diagnosis present

## 2019-01-07 DIAGNOSIS — I214 Non-ST elevation (NSTEMI) myocardial infarction: Secondary | ICD-10-CM | POA: Diagnosis present

## 2019-01-07 DIAGNOSIS — I2510 Atherosclerotic heart disease of native coronary artery without angina pectoris: Secondary | ICD-10-CM | POA: Diagnosis not present

## 2019-01-07 DIAGNOSIS — R030 Elevated blood-pressure reading, without diagnosis of hypertension: Secondary | ICD-10-CM | POA: Diagnosis present

## 2019-01-07 DIAGNOSIS — R51 Headache: Secondary | ICD-10-CM | POA: Diagnosis present

## 2019-01-07 HISTORY — PX: LEFT HEART CATH AND CORONARY ANGIOGRAPHY: CATH118249

## 2019-01-07 LAB — BASIC METABOLIC PANEL
Anion gap: 9 (ref 5–15)
BUN: 9 mg/dL (ref 8–23)
CO2: 24 mmol/L (ref 22–32)
Calcium: 9 mg/dL (ref 8.9–10.3)
Chloride: 107 mmol/L (ref 98–111)
Creatinine, Ser: 0.74 mg/dL (ref 0.61–1.24)
GFR calc Af Amer: >60 mL/min (ref >60)
GFR calc non Af Amer: >60 mL/min (ref >60)
Glucose, Bld: 92 mg/dL (ref 70–99)
Potassium: 3.8 mmol/L (ref 3.5–5.1)
Sodium: 140 mmol/L (ref 135–145)

## 2019-01-07 LAB — CBC
HCT: 40.8 % (ref 39.0–52.0)
Hemoglobin: 14.5 g/dL (ref 13.0–17.0)
MCH: 33.3 pg (ref 26.0–34.0)
MCHC: 35.5 g/dL (ref 30.0–36.0)
MCV: 93.6 fL (ref 80.0–100.0)
Platelets: 211 10*3/uL (ref 150–400)
RBC: 4.36 MIL/uL (ref 4.22–5.81)
RDW: 12.3 % (ref 11.5–15.5)
WBC: 9 10*3/uL (ref 4.0–10.5)
nRBC: 0 % (ref 0.0–0.2)

## 2019-01-07 LAB — LIPID PANEL
Cholesterol: 175 mg/dL (ref 0–200)
HDL: 56 mg/dL (ref >40)
LDL Cholesterol: 110 mg/dL — ABNORMAL HIGH (ref 0–99)
Total CHOL/HDL Ratio: 3.1 RATIO
Triglycerides: 45 mg/dL (ref <150)
VLDL: 9 mg/dL (ref 0–40)

## 2019-01-07 LAB — HIV ANTIBODY (ROUTINE TESTING W REFLEX): HIV Screen 4th Generation wRfx: NONREACTIVE

## 2019-01-07 LAB — TROPONIN I: Troponin I: 1.53 ng/mL (ref <0.03)

## 2019-01-07 LAB — HEMOGLOBIN A1C
Hgb A1c MFr Bld: 5.2 % (ref 4.8–5.6)
Mean Plasma Glucose: 102.54 mg/dL

## 2019-01-07 LAB — ECHOCARDIOGRAM COMPLETE
Height: 66 in
Weight: 1856 oz

## 2019-01-07 LAB — HEPARIN LEVEL (UNFRACTIONATED): HEPARIN UNFRACTIONATED: 0.42 [IU]/mL (ref 0.30–0.70)

## 2019-01-07 SURGERY — LEFT HEART CATH AND CORONARY ANGIOGRAPHY
Anesthesia: LOCAL

## 2019-01-07 MED ORDER — ASPIRIN 81 MG PO CHEW
81.0000 mg | CHEWABLE_TABLET | ORAL | Status: AC
  Administered 2019-01-07: 81 mg via ORAL
  Filled 2019-01-07: qty 1

## 2019-01-07 MED ORDER — SODIUM CHLORIDE 0.9% FLUSH: 3.0000 mL | Freq: Two times a day (BID) | INTRAVENOUS | Status: DC

## 2019-01-07 MED ORDER — FENTANYL CITRATE (PF) 100 MCG/2ML IJ SOLN
INTRAMUSCULAR | Status: AC
  Filled 2019-01-07: qty 2

## 2019-01-07 MED ORDER — VERAPAMIL HCL 2.5 MG/ML IV SOLN
INTRAVENOUS | Status: AC
  Filled 2019-01-07: qty 2

## 2019-01-07 MED ORDER — HEPARIN (PORCINE) IN NACL 1000-0.9 UT/500ML-% IV SOLN
INTRAVENOUS | Status: AC
  Filled 2019-01-07: qty 500

## 2019-01-07 MED ORDER — SODIUM CHLORIDE 0.9 % IV SOLN: 250.0000 mL | INTRAVENOUS | Status: DC | PRN

## 2019-01-07 MED ORDER — HEPARIN (PORCINE) IN NACL 1000-0.9 UT/500ML-% IV SOLN
INTRAVENOUS | Status: DC | PRN
  Administered 2019-01-07 (×2): 500 mL

## 2019-01-07 MED ORDER — MIDAZOLAM HCL 2 MG/2ML IJ SOLN
INTRAMUSCULAR | Status: DC | PRN
  Administered 2019-01-07: 2 mg via INTRAVENOUS

## 2019-01-07 MED ORDER — HEPARIN SODIUM (PORCINE) 1000 UNIT/ML IJ SOLN
INTRAMUSCULAR | Status: DC | PRN
  Administered 2019-01-07: 4000 [IU] via INTRAVENOUS

## 2019-01-07 MED ORDER — FENTANYL CITRATE (PF) 100 MCG/2ML IJ SOLN
INTRAMUSCULAR | Status: DC | PRN
  Administered 2019-01-07: 25 ug via INTRAVENOUS

## 2019-01-07 MED ORDER — SODIUM CHLORIDE 0.9% FLUSH: 3.0000 mL | INTRAVENOUS | Status: DC | PRN

## 2019-01-07 MED ORDER — IOHEXOL 350 MG/ML SOLN
INTRAVENOUS | Status: DC | PRN
  Administered 2019-01-07: 70 mL via INTRACARDIAC

## 2019-01-07 MED ORDER — LIDOCAINE HCL (PF) 1 % IJ SOLN
INTRAMUSCULAR | Status: DC | PRN
  Administered 2019-01-07: 2 mL via INTRADERMAL

## 2019-01-07 MED ORDER — CLOPIDOGREL BISULFATE 75 MG PO TABS
300.0000 mg | ORAL_TABLET | Freq: Once | ORAL | Status: AC
  Administered 2019-01-07: 300 mg via ORAL
  Filled 2019-01-07: qty 4

## 2019-01-07 MED ORDER — VERAPAMIL HCL 2.5 MG/ML IV SOLN
INTRAVENOUS | Status: DC | PRN
  Administered 2019-01-07: 10 mL via INTRA_ARTERIAL

## 2019-01-07 MED ORDER — SODIUM CHLORIDE 0.9 % IV SOLN: INTRAVENOUS | Status: AC

## 2019-01-07 MED ORDER — MIDAZOLAM HCL 2 MG/2ML IJ SOLN
INTRAMUSCULAR | Status: AC
  Filled 2019-01-07: qty 2

## 2019-01-07 MED ORDER — SODIUM CHLORIDE 0.9 % WEIGHT BASED INFUSION
1.0000 mL/kg/h | INTRAVENOUS | Status: DC
  Administered 2019-01-07: 1 mL/kg/h via INTRAVENOUS

## 2019-01-07 MED ORDER — HEPARIN SODIUM (PORCINE) 1000 UNIT/ML IJ SOLN
INTRAMUSCULAR | Status: AC
  Filled 2019-01-07: qty 1

## 2019-01-07 MED ORDER — CLOPIDOGREL BISULFATE 75 MG PO TABS
75.0000 mg | ORAL_TABLET | Freq: Every day | ORAL | Status: DC
  Administered 2019-01-08: 75 mg via ORAL
  Filled 2019-01-07: qty 1

## 2019-01-07 MED ORDER — SODIUM CHLORIDE 0.9 % WEIGHT BASED INFUSION: 3.0000 mL/kg/h | INTRAVENOUS | Status: DC

## 2019-01-07 MED ORDER — SODIUM CHLORIDE 0.9% FLUSH
3.0000 mL | Freq: Two times a day (BID) | INTRAVENOUS | Status: DC
  Administered 2019-01-08: 3 mL via INTRAVENOUS

## 2019-01-07 MED ORDER — LIDOCAINE HCL (PF) 1 % IJ SOLN
INTRAMUSCULAR | Status: AC
  Filled 2019-01-07: qty 30

## 2019-01-07 SURGICAL SUPPLY — 9 items

## 2019-01-07 NOTE — Progress Notes (Signed)
CM called to clarify inpatient/OP status. To clarify, Dr. Angelena Form would like pt to stay overnight and be dc in AM if feeling well so they were changed to inpatient order. Alvie Speltz PA-C

## 2019-01-07 NOTE — Plan of Care (Signed)
  Problem: Clinical Measurements: Goal: Will remain free from infection Outcome: Progressing Note:  No s/s of infection noted. Goal: Respiratory complications will improve Outcome: Progressing Note:  No s/s of respiratory complications noted.  Stable on room air. Goal: Cardiovascular complication will be avoided Outcome: Progressing Note:  No s/s of cardiovascular complication noted.  VSS.

## 2019-01-07 NOTE — Progress Notes (Addendum)
Progress Note  Patient Name: Thomas Kelley Date of Encounter: 01/07/2019  Primary Cardiologist: Minus Breeding, MD  Subjective   Feeling well overnight without CP. Has been stuttering for a few days now. Retired, does [employer] work. No hx HTN.  Inpatient Medications    Scheduled Meds: . aspirin  81 mg Oral Pre-Cath  . aspirin EC  81 mg Oral Daily  . metoprolol succinate  12.5 mg Oral Daily  . rosuvastatin  40 mg Oral Daily  . sodium chloride flush  3 mL Intravenous Q12H   Continuous Infusions: . sodium chloride 10 mL/hr (01/07/19 0547)  . sodium chloride    . sodium chloride 3 mL/kg/hr (01/07/19 0718)   Followed by  . sodium chloride    . heparin 650 Units/hr (01/06/19 2131)   PRN Meds: sodium chloride, acetaminophen, nitroGLYCERIN, ondansetron (ZOFRAN) IV, sodium chloride flush   Vital Signs    Vitals:   01/06/19 2309 01/06/19 2317 01/07/19 0538 01/07/19 0540  BP: (!) 161/100 (!) 137/92 (!) 125/93   Pulse: 74 67 67   Resp:   18   Temp: 98 F (36.7 C) 97.8 F (36.6 C) 97.9 F (36.6 C)   TempSrc: Oral Oral Oral   SpO2: 98%  100%   Weight: 53 kg   52.6 kg  Height: 5\' 6"  (1.676 m)       Intake/Output Summary (Last 24 hours) at 01/07/2019 0722 Last data filed at 01/07/2019 0553 Gross per 24 hour  Intake 327.18 ml  Output 375 ml  Net -47.82 ml   Last 3 Weights 01/07/2019 01/06/2019 01/06/2019  Weight (lbs) 116 lb 116 lb 12.8 oz 120 lb  Weight (kg) 52.617 kg 52.98 kg 54.432 kg     Telemetry    NSR - Personally Reviewed  ECG    NSR yesterday, nonacute - Personally Reviewed  Physical Exam   GEN: No acute distress.  HEENT: Normocephalic, atraumatic, sclera non-icteric. Neck: No JVD or bruits. Cardiac: RRR no murmurs, rubs, or gallops.  Radials/DP/PT 1+ and equal bilaterally.  Respiratory: Clear to auscultation bilaterally. Breathing is unlabored. GI: Soft, nontender, non-distended, BS +x 4. MS: no deformity. Extremities: No clubbing or cyanosis.  No edema. Distal pedal pulses are 2+ and equal bilaterally. Neuro:  AAOx3. Follows commands. Psych:  Responds to questions appropriately with a normal affect.  Labs    Chemistry Recent Labs  Lab 01/06/19 2045 01/07/19 0322  NA 139 140  K 3.8 3.8  CL 106 107  CO2 25 24  GLUCOSE 116* 92  BUN 12 9  CREATININE 0.80 0.74  CALCIUM 9.1 9.0  GFRNONAA >60 >60  GFRAA >60 >60  ANIONGAP 8 9     Hematology Recent Labs  Lab 01/06/19 2045 01/07/19 0322  WBC 9.6 9.0  RBC 4.44 4.36  HGB 14.4 14.5  HCT 42.2 40.8  MCV 95.0 93.6  MCH 32.4 33.3  MCHC 34.1 35.5  RDW 12.4 12.3  PLT 198 211    Cardiac Enzymes Recent Labs  Lab 01/07/19 0322  TROPONINI 1.53*    Recent Labs  Lab 01/06/19 2052  TROPIPOC 1.05*     BNPNo results for input(s): BNP, PROBNP in the last 168 hours.   DDimer No results for input(s): DDIMER in the last 168 hours.   Radiology    Dg Chest 2 View  Result Date: 01/06/2019 CLINICAL DATA:  Chest pain beginning yesterday. EXAM: CHEST - 2 VIEW COMPARISON:  None. FINDINGS: Heart size is normal. There is no edema  or effusion. The lungs are hyperinflated. No focal airspace disease is present. The visualized soft tissues and bony thorax are unremarkable. IMPRESSION: 1. No acute cardiopulmonary disease. 2. Hyperinflation of both lungs without focal airspace disease. Electronically Signed   By: San Morelle M.D.   On: 01/06/2019 19:28    Cardiac Studies   Pending  Patient Profile     63 y.o. male with borderline HLD and no other significant PMH presented with CP and ruled in for NSTEMI.  Assessment & Plan    1. NSTEMI - plan cath today. Risks and benefits of cardiac catheterization have been discussed with the patient.  These include bleeding, infection, kidney damage, stroke, heart attack, death.  The patient understands these risks and is willing to proceed. Continue IV heparin, BB, ASA, statin.  2. Elevated BP without dx of HTN - metoprolol has been  started. He had 5mg  of lisinopril due to begin this morning as well but since BP has normalized will hold off and await cath report in case he requires addition of antianginals as part of medical therapy.  3. HLD - Crestor increased, apparently just started yesterday. If the patient is tolerating statin at time of follow-up appointment, would consider rechecking liver function/lipid panel in 6-8 weeks.   For questions or updates, please contact [place] Please consult [email or web address] for contact info under Cardiology/STEMI.  Signed, Charlie Pitter, PA-C 01/07/2019, 7:22 AM    History and all data above reviewed.  Patient examined.  I agree with the findings as above.  New onset chest pain in a patient without prior disease.  Positive trop.  NSTEMI.   The patient exam reveals COR:RRR  ,  Lungs: Clear  ,  Abd: Positive bowel sounds, no rebound no guarding, Ext No edema  .  All available labs, radiology testing, previous records reviewed. Agree with documented assessment and plan. NSTEMI:  Plan cardiac cath later today.  The patient understands that risks included but are not limited to stroke (1 in 1000), death (1 in 12), kidney failure [usually temporary] (1 in 500), bleeding (1 in 200), allergic reaction [possibly serious] (1 in 200).  The patient understands and agrees to proceed.   Minus Breeding  8:35 AM  01/07/2019

## 2019-01-07 NOTE — Interval H&P Note (Signed)
History and Physical Interval Note:  01/07/2019 11:49 AM  Thomas Kelley  has presented today for cardiac cath with the diagnosis of nstemi  The various methods of treatment have been discussed with the patient and family. After consideration of risks, benefits and other options for treatment, the patient has consented to  Procedure(s): LEFT HEART CATH AND CORONARY ANGIOGRAPHY (N/A) as a surgical intervention .  The patient's history has been reviewed, patient examined, no change in status, stable for surgery.  I have reviewed the patient's chart and labs.  Questions were answered to the patient's satisfaction.    Cath Lab Visit (complete for each Cath Lab visit)  Clinical Evaluation Leading to the Procedure:   ACS: Yes.    Non-ACS:    Anginal Classification: CCS III  Anti-ischemic medical therapy: No Therapy  Non-Invasive Test Results: No non-invasive testing performed  Prior CABG: No previous CABG        Lauree Chandler

## 2019-01-07 NOTE — H&P (View-Only) (Signed)
Progress Note  Patient Name: Thomas Kelley Date of Encounter: 01/07/2019  Primary Cardiologist: Minus Breeding, MD  Subjective   Feeling well overnight without CP. Has been stuttering for a few days now. Retired, does [employer] work. No hx HTN.  Inpatient Medications    Scheduled Meds: . aspirin  81 mg Oral Pre-Cath  . aspirin EC  81 mg Oral Daily  . metoprolol succinate  12.5 mg Oral Daily  . rosuvastatin  40 mg Oral Daily  . sodium chloride flush  3 mL Intravenous Q12H   Continuous Infusions: . sodium chloride 10 mL/hr (01/07/19 0547)  . sodium chloride    . sodium chloride 3 mL/kg/hr (01/07/19 0718)   Followed by  . sodium chloride    . heparin 650 Units/hr (01/06/19 2131)   PRN Meds: sodium chloride, acetaminophen, nitroGLYCERIN, ondansetron (ZOFRAN) IV, sodium chloride flush   Vital Signs    Vitals:   01/06/19 2309 01/06/19 2317 01/07/19 0538 01/07/19 0540  BP: (!) 161/100 (!) 137/92 (!) 125/93   Pulse: 74 67 67   Resp:   18   Temp: 98 F (36.7 C) 97.8 F (36.6 C) 97.9 F (36.6 C)   TempSrc: Oral Oral Oral   SpO2: 98%  100%   Weight: 53 kg   52.6 kg  Height: 5\' 6"  (1.676 m)       Intake/Output Summary (Last 24 hours) at 01/07/2019 0722 Last data filed at 01/07/2019 0553 Gross per 24 hour  Intake 327.18 ml  Output 375 ml  Net -47.82 ml   Last 3 Weights 01/07/2019 01/06/2019 01/06/2019  Weight (lbs) 116 lb 116 lb 12.8 oz 120 lb  Weight (kg) 52.617 kg 52.98 kg 54.432 kg     Telemetry    NSR - Personally Reviewed  ECG    NSR yesterday, nonacute - Personally Reviewed  Physical Exam   GEN: No acute distress.  HEENT: Normocephalic, atraumatic, sclera non-icteric. Neck: No JVD or bruits. Cardiac: RRR no murmurs, rubs, or gallops.  Radials/DP/PT 1+ and equal bilaterally.  Respiratory: Clear to auscultation bilaterally. Breathing is unlabored. GI: Soft, nontender, non-distended, BS +x 4. MS: no deformity. Extremities: No clubbing or cyanosis.  No edema. Distal pedal pulses are 2+ and equal bilaterally. Neuro:  AAOx3. Follows commands. Psych:  Responds to questions appropriately with a normal affect.  Labs    Chemistry Recent Labs  Lab 01/06/19 2045 01/07/19 0322  NA 139 140  K 3.8 3.8  CL 106 107  CO2 25 24  GLUCOSE 116* 92  BUN 12 9  CREATININE 0.80 0.74  CALCIUM 9.1 9.0  GFRNONAA >60 >60  GFRAA >60 >60  ANIONGAP 8 9     Hematology Recent Labs  Lab 01/06/19 2045 01/07/19 0322  WBC 9.6 9.0  RBC 4.44 4.36  HGB 14.4 14.5  HCT 42.2 40.8  MCV 95.0 93.6  MCH 32.4 33.3  MCHC 34.1 35.5  RDW 12.4 12.3  PLT 198 211    Cardiac Enzymes Recent Labs  Lab 01/07/19 0322  TROPONINI 1.53*    Recent Labs  Lab 01/06/19 2052  TROPIPOC 1.05*     BNPNo results for input(s): BNP, PROBNP in the last 168 hours.   DDimer No results for input(s): DDIMER in the last 168 hours.   Radiology    Dg Chest 2 View  Result Date: 01/06/2019 CLINICAL DATA:  Chest pain beginning yesterday. EXAM: CHEST - 2 VIEW COMPARISON:  None. FINDINGS: Heart size is normal. There is no edema  or effusion. The lungs are hyperinflated. No focal airspace disease is present. The visualized soft tissues and bony thorax are unremarkable. IMPRESSION: 1. No acute cardiopulmonary disease. 2. Hyperinflation of both lungs without focal airspace disease. Electronically Signed   By: San Morelle M.D.   On: 01/06/2019 19:28    Cardiac Studies   Pending  Patient Profile     63 y.o. male with borderline HLD and no other significant PMH presented with CP and ruled in for NSTEMI.  Assessment & Plan    1. NSTEMI - plan cath today. Risks and benefits of cardiac catheterization have been discussed with the patient.  These include bleeding, infection, kidney damage, stroke, heart attack, death.  The patient understands these risks and is willing to proceed. Continue IV heparin, BB, ASA, statin.  2. Elevated BP without dx of HTN - metoprolol has been  started. He had 5mg  of lisinopril due to begin this morning as well but since BP has normalized will hold off and await cath report in case he requires addition of antianginals as part of medical therapy.  3. HLD - Crestor increased, apparently just started yesterday. If the patient is tolerating statin at time of follow-up appointment, would consider rechecking liver function/lipid panel in 6-8 weeks.   For questions or updates, please contact [place] Please consult [email or web address] for contact info under Cardiology/STEMI.  Signed, Charlie Pitter, PA-C 01/07/2019, 7:22 AM    History and all data above reviewed.  Patient examined.  I agree with the findings as above.  New onset chest pain in a patient without prior disease.  Positive trop.  NSTEMI.   The patient exam reveals COR:RRR  ,  Lungs: Clear  ,  Abd: Positive bowel sounds, no rebound no guarding, Ext No edema  .  All available labs, radiology testing, previous records reviewed. Agree with documented assessment and plan. NSTEMI:  Plan cardiac cath later today.  The patient understands that risks included but are not limited to stroke (1 in 1000), death (1 in 78), kidney failure [usually temporary] (1 in 500), bleeding (1 in 200), allergic reaction [possibly serious] (1 in 200).  The patient understands and agrees to proceed.   Minus Breeding  8:35 AM  01/07/2019

## 2019-01-07 NOTE — Progress Notes (Signed)
ANTICOAGULATION CONSULT NOTE   Pharmacy Consult for Heparin Indication: chest pain/ACS  No Known Allergies  Patient Measurements: Height: 5\' 6"  (167.6 cm) Weight: 116 lb 12.8 oz (53 kg) IBW/kg (Calculated) : 63.8 Heparin Dosing Weight: 54.4  Vital Signs: Temp: 97.8 F (36.6 C) (03/02 2317) Temp Source: Oral (03/02 2317) BP: 137/92 (03/02 2317) Pulse Rate: 67 (03/02 2317)  Labs: Recent Labs    01/06/19 2045 01/06/19 2105 01/07/19 0322  HGB 14.4  --  14.5  HCT 42.2  --  40.8  PLT 198  --  211  APTT  --  29  --   HEPARINUNFRC  --   --  0.42  CREATININE 0.80  --   --     Estimated Creatinine Clearance: 71.8 mL/min (by C-G formula based on SCr of 0.8 mg/dL).   Medical History: Past Medical History:  Diagnosis Date  . Hyperlipidemia    borderline    Medications:  Scheduled:  . aspirin EC  81 mg Oral Daily  . lisinopril  5 mg Oral Daily  . metoprolol succinate  12.5 mg Oral Daily  . rosuvastatin  40 mg Oral Daily   Infusions:  . sodium chloride    . heparin 650 Units/hr (01/06/19 2131)    Assessment: 22 yoM admitted 01/06/19 with chest pain. On admission patient's troponin is elevated at 1.05. Patient not on anticoagulants PTA. Patient's baseline CBC stable.   3/3 AM update: initial heparin level therapeutic   Goal of Therapy:  Heparin level 0.3-0.7 units/ml Monitor platelets by anticoagulation protocol: Yes   Plan:  Cont heparin at 650 units/hr Confirmatory heparin level at [place], PharmD, [place]ha Pharmacist Phone: [phone]

## 2019-01-07 NOTE — Progress Notes (Signed)
  Echocardiogram 2D Echocardiogram has been performed.  Thomas Kelley 01/07/2019, 10:38 AM

## 2019-01-08 ENCOUNTER — Telehealth: Payer: Self-pay | Admitting: Physician Assistant

## 2019-01-08 ENCOUNTER — Encounter (HOSPITAL_COMMUNITY): Payer: Self-pay | Admitting: Physician Assistant

## 2019-01-08 DIAGNOSIS — E785 Hyperlipidemia, unspecified: Secondary | ICD-10-CM

## 2019-01-08 DIAGNOSIS — I201 Angina pectoris with documented spasm: Secondary | ICD-10-CM

## 2019-01-08 DIAGNOSIS — R519 Headache, unspecified: Secondary | ICD-10-CM

## 2019-01-08 DIAGNOSIS — R030 Elevated blood-pressure reading, without diagnosis of hypertension: Secondary | ICD-10-CM

## 2019-01-08 DIAGNOSIS — R51 Headache: Secondary | ICD-10-CM

## 2019-01-08 LAB — BASIC METABOLIC PANEL
Anion gap: 6 (ref 5–15)
BUN: 14 mg/dL (ref 8–23)
CO2: 22 mmol/L (ref 22–32)
Calcium: 8.8 mg/dL — ABNORMAL LOW (ref 8.9–10.3)
Chloride: 110 mmol/L (ref 98–111)
Creatinine, Ser: 0.77 mg/dL (ref 0.61–1.24)
GFR calc Af Amer: >60 mL/min (ref >60)
GFR calc non Af Amer: >60 mL/min (ref >60)
GLUCOSE: 97 mg/dL (ref 70–99)
Potassium: 4 mmol/L (ref 3.5–5.1)
Sodium: 138 mmol/L (ref 135–145)

## 2019-01-08 LAB — CBC
HCT: 40.5 % (ref 39.0–52.0)
Hemoglobin: 13.9 g/dL (ref 13.0–17.0)
MCH: 32.5 pg (ref 26.0–34.0)
MCHC: 34.3 g/dL (ref 30.0–36.0)
MCV: 94.6 fL (ref 80.0–100.0)
Platelets: 203 10*3/uL (ref 150–400)
RBC: 4.28 MIL/uL (ref 4.22–5.81)
RDW: 12.6 % (ref 11.5–15.5)
WBC: 7.1 10*3/uL (ref 4.0–10.5)
nRBC: 0 % (ref 0.0–0.2)

## 2019-01-08 MED ORDER — IBUPROFEN 600 MG PO TABS
600.0000 mg | ORAL_TABLET | Freq: Once | ORAL | Status: AC | PRN
  Administered 2019-01-08: 600 mg via ORAL
  Filled 2019-01-08: qty 1

## 2019-01-08 MED ORDER — AMLODIPINE BESYLATE 2.5 MG PO TABS: 2.5000 mg | ORAL_TABLET | Freq: Every day | ORAL | 6 refills | Status: DC

## 2019-01-08 MED ORDER — AMLODIPINE BESYLATE 2.5 MG PO TABS: 2.5000 mg | ORAL_TABLET | Freq: Every day | ORAL | Status: DC

## 2019-01-08 MED ORDER — PANTOPRAZOLE SODIUM 40 MG PO TBEC
40.0000 mg | DELAYED_RELEASE_TABLET | Freq: Once | ORAL | Status: AC
  Administered 2019-01-08: 40 mg via ORAL
  Filled 2019-01-08: qty 1

## 2019-01-08 MED ORDER — CLOPIDOGREL BISULFATE 75 MG PO TABS: 75.0000 mg | ORAL_TABLET | Freq: Every day | ORAL | 6 refills | Status: DC

## 2019-01-08 MED ORDER — NITROGLYCERIN 0.4 MG SL SUBL: 0.4000 mg | SUBLINGUAL_TABLET | SUBLINGUAL | 3 refills | Status: DC | PRN

## 2019-01-08 MED ORDER — ROSUVASTATIN CALCIUM 20 MG PO TABS: 20.0000 mg | ORAL_TABLET | Freq: Every day | ORAL | 6 refills | Status: DC

## 2019-01-08 MED ORDER — ROSUVASTATIN CALCIUM 20 MG PO TABS
20.0000 mg | ORAL_TABLET | Freq: Every day | ORAL | Status: DC
  Administered 2019-01-08: 20 mg via ORAL
  Filled 2019-01-08: qty 1

## 2019-01-08 MED ORDER — ASPIRIN 81 MG PO TBEC: 81.0000 mg | DELAYED_RELEASE_TABLET | Freq: Every day | ORAL | 6 refills | Status: DC

## 2019-01-08 MED FILL — AMLODIPINE BESYLATE 2.5 MG: 2.5 | 30 days supply | Qty: 30 | Fill #0 | Status: TO

## 2019-01-08 MED FILL — ROSUVASTATIN CALCIUM 20 MG: 20 | 30 days supply | Qty: 30 | Fill #0 | Status: TO

## 2019-01-08 MED FILL — ASPIRIN LOW DOSE 81 MG TBEC: 81 | 30 days supply | Qty: 30 | Fill #0 | Status: TO

## 2019-01-08 MED FILL — NITROGLYCERIN 0.4 MG TAB SL: 0.4 | 8 days supply | Qty: 25 | Fill #0 | Status: TO

## 2019-01-08 MED FILL — CLOPIDOGREL 75 MG TABLET: 75 | 30 days supply | Qty: 30 | Fill #0 | Status: TO

## 2019-01-08 NOTE — Progress Notes (Signed)
CARDIAC REHAB PHASE I   PRE:  Rate/Rhythm: 67 SR    BP: sitting 132/77    SaO2:   MODE:  Ambulation: 470 ft   POST:  Rate/Rhythm: 84 SR    BP: sitting 132/73     SaO2:   Pt able to walk his normal quick pace without CP. Sts he is slightly weaker than normal with lightheadedness and continued HA. His HA has decreased from 8/10 to 6/10 today. Discussed MI, 1 week restrictions, diet, ex (he walks 2 miles daily), and CRPII. Will refer to [place] as pt is interested. Pt thankful for information. Gave pt caffeine to help with HA when I left (per PA). [place], [place] 01/08/2019 12:00 PM

## 2019-01-08 NOTE — Progress Notes (Addendum)
Progress Note  Patient Name: Thomas Kelley Date of Encounter: 01/08/2019  Primary Cardiologist: Minus Breeding, MD  Subjective   No CP or SOB. Cath site looks good. However, pt has had headache since yesterday evening, temporal over R eye - usually when he gets these at home he takes Excedrin migraine (ASA/Tylenol/caffeine) and it goes away. He got Tylenol last night with no relief. Also experiencing nausea this AM. No other focal neuro sx.  Inpatient Medications    Scheduled Meds: . aspirin EC  81 mg Oral Daily  . clopidogrel  75 mg Oral Daily  . metoprolol succinate  12.5 mg Oral Daily  . rosuvastatin  40 mg Oral Daily  . sodium chloride flush  3 mL Intravenous Q12H   Continuous Infusions: . sodium chloride 10 mL/hr (01/07/19 0547)  . sodium chloride     PRN Meds: sodium chloride, acetaminophen, nitroGLYCERIN, ondansetron (ZOFRAN) IV, sodium chloride flush   Vital Signs    Vitals:   01/07/19 1223 01/07/19 2004 01/08/19 0512 01/08/19 0513  BP: 132/83 124/83 121/84   Pulse: 87 75 70   Resp: (!) 8 17 17    Temp:  98.1 F (36.7 C) 98.2 F (36.8 C)   TempSrc:  Oral Oral   SpO2: 99% 100% 100%   Weight:    51.8 kg  Height:        Intake/Output Summary (Last 24 hours) at 01/08/2019 0755 Last data filed at 01/07/2019 2203 Gross per 24 hour  Intake 1853.83 ml  Output -  Net 1853.83 ml   Last 3 Weights 01/08/2019 01/07/2019 01/06/2019  Weight (lbs) 114 lb 3.2 oz 116 lb 116 lb 12.8 oz  Weight (kg) 51.801 kg 52.617 kg 52.98 kg     Telemetry    NSR - Personally Reviewed  Physical Exam   GEN: No acute distress.  HEENT: Normocephalic, atraumatic, sclera non-icteric. Neck: No JVD or bruits. Cardiac: RRR no murmurs, rubs, or gallops.  Radials/DP/PT 1+ and equal bilaterally.  Respiratory: Clear to auscultation bilaterally. Breathing is unlabored. GI: Soft, nontender, non-distended, BS +x 4. MS: no deformity. Extremities: No clubbing or cyanosis. No edema. Distal pedal pulses  are 2+ and equal bilaterally. Right radial cath site without hematoma or ecchymosis; good pulse Neuro:  AAOx3. Follows commands. Psych:  Responds to questions appropriately with a normal affect.  Labs    Chemistry Recent Labs  Lab 01/06/19 2045 01/07/19 0322 01/08/19 0157  NA 139 140 138  K 3.8 3.8 4.0  CL 106 107 110  CO2 25 24 22   GLUCOSE 116* 92 97  BUN 12 9 14   CREATININE 0.80 0.74 0.77  CALCIUM 9.1 9.0 8.8*  GFRNONAA >60 >60 >60  GFRAA >60 >60 >60  ANIONGAP 8 9 6      Hematology Recent Labs  Lab 01/06/19 2045 01/07/19 0322 01/08/19 0157  WBC 9.6 9.0 7.1  RBC 4.44 4.36 4.28  HGB 14.4 14.5 13.9  HCT 42.2 40.8 40.5  MCV 95.0 93.6 94.6  MCH 32.4 33.3 32.5  MCHC 34.1 35.5 34.3  RDW 12.4 12.3 12.6  PLT 198 211 203    Cardiac Enzymes Recent Labs  Lab 01/07/19 0322  TROPONINI 1.53*    Recent Labs  Lab 01/06/19 2052  TROPIPOC 1.05*     BNPNo results for input(s): BNP, PROBNP in the last 168 hours.   DDimer No results for input(s): DDIMER in the last 168 hours.   Radiology    Dg Chest 2 View  Result Date: 01/06/2019  CLINICAL DATA:  Chest pain beginning yesterday. EXAM: CHEST - 2 VIEW COMPARISON:  None. FINDINGS: Heart size is normal. There is no edema or effusion. The lungs are hyperinflated. No focal airspace disease is present. The visualized soft tissues and bony thorax are unremarkable. IMPRESSION: 1. No acute cardiopulmonary disease. 2. Hyperinflation of both lungs without focal airspace disease. Electronically Signed   By: San Morelle M.D.   On: 01/06/2019 19:28    Cardiac Studies   2d echo yesterday IMPRESSIONS   1. The left ventricle has normal systolic function with an ejection fraction of 60-65%. The cavity size was normal. Left ventricular diastolic parameters were normal.  2. The right ventricle has normal systolic function. The cavity was normal. There is no increase in right ventricular wall thickness.  3. The mitral valve is  normal in structure.  4. The tricuspid valve is normal in structure.  5. The aortic valve is normal in structure.  6. The pulmonic valve was normal in structure.  Patient Profile     63 y.o. male with borderline HLD and no other significant PMH presented with CP and ruled in for NSTEMI.  Assessment & Plan    1. NSTEMI - cath with only 30% RCA, otherwise no culprit vessels. ? Variant angina due to vasospasm. Patient has no other signs/sx for concern for alternative causes such as PE, myocarditis, or drug-induced event. Per cath note Dr. Angelena Form suggests DAPT for at least 6 months, plus statin and BB. Will have patient ambulate with cardiac rehab today. With h/o headaches would probably avoid nitrate but could consider initiation of amlodipine if needed.  2. Elevated BP without dx of HTN - improved, now on beta blocker. Consideration should be given to adding amlodipine in the future if BP needs further control, to optimize therapy for #1.  3. HLD - Crestor increased (literally just prescribed on 3/2 as OP). Cathing note says continue high intensity statin but patient reports significant nausea. Since this can be side effect of Crestor will lower dose to 20mg  daily for now. If the patient is tolerating statin at time of follow-up appointment, would consider rechecking liver function/lipid panel in 6-8 weeks.  4. Headache/nausea  - pt reports at home he takes Excedrin typically with relief. He is already on the ASA component and took Tylenol last night without relief. No focal neuro sx. Will start with a dose of Zofran to control nausea then rx ibuprofen 600mg  x 1 (given no significant CAD) and Protonix x 1 once nausea is under control. Hopefully if this improves he can be DC later early afternoon.  Cardiac rehab phase 1 to see and ambulate.  For questions or updates, please contact [place] Please consult [email or web address] for contact info under Cardiology/STEMI.  Signed, Charlie Pitter,  PA-C 01/08/2019, 7:55 AM    History and all data above reviewed.  Patient examined.  No further chest pain.  Headache improved.   I agree with the findings as above.  The patient exam reveals COR:RRR  ,  Lungs: Clear  ,  Abd: Positive bowel sounds, no rebound no guarding, Ext No edema, right wrist without bruising or bleeding.     All available labs, radiology testing, previous records reviewed. Agree with documented assessment and plan.   NQWMI:  Question spasm.  Started Norvasc.  No beta blocker.  Home with meds as listed.  SLNTG.   I will continue DAPT for about six months.     Minus Breeding  12:39 PM  01/08/2019  

## 2019-01-08 NOTE — Telephone Encounter (Signed)
Attention TOC pool,  This patient will need a TOC phone call after discharge. They are likely being discharged today. Follow-up appointment has already been arranged with: Arnold Long 01/22/19 They are a patient of Minus Breeding, MD.  Thank you! Charlie Pitter

## 2019-01-08 NOTE — Discharge Summary (Signed)
Discharge Summary    Patient ID: Thomas Kelley,  MRN: [id number], DOB/AGE: [birth year] 63 y.o.  Admit date: 01/06/2019 Discharge date: 01/08/2019  Primary Care Provider: Lorene Dy Primary Cardiologist: Minus Breeding, MD Primary Electrophysiologist:  None  Discharge Diagnoses    Principal Problem:   NSTEMI (non-ST elevated myocardial infarction) [facility]) Active Problems:   Coronary vasospasm ([facility])   Hyperlipidemia, mild   Headache   Elevated blood pressure reading without diagnosis of hypertension    Diagnostic Studies/Procedures    2D echo 01/07/19 IMPRESSIONS 1. The left ventricle has normal systolic function with an ejection fraction of 60-65%. The cavity size was normal. Left ventricular diastolic parameters were normal.  2. The right ventricle has normal systolic function. The cavity was normal. There is no increase in right ventricular wall thickness.  3. The mitral valve is normal in structure.  4. The tricuspid valve is normal in structure.  5. The aortic valve is normal in structure.  6. The pulmonic valve was normal in structure.  Cardiac Cath 01/07/19 Conclusion    Ost RCA to Prox RCA lesion is 30% stenosed.   1. No evidence of disease in the left main, Circumflex or LAD 2. The RCA is a large dominant artery. One view shows what appears to be non-obstructive plaque in the proximal vessel just beyond the ostium. There is no dampening of the catheter with engagement of the vessel. Good reflux back into the aorta. This stenosis appears to be mild and not obstructive.  3. No obvious culprits lesions are seen on angiography  Recommendations: No focal lesions that are felt to represent ulcerated plaques or significant obstruction. He was admitted with a classic story of unstable angina and had mild elevation of troponin. Follow up on echo. Consideration for coronary vasospasm.  Given elevation of troponin, I think it is reasonable to treat with DAPT with ASA and Plavix  for at least six months. Would continue high intensity statin and beta blocker. Ambulate and if subsequent chest pain, consider adding a long acting nitrate.     _____________     History of Present Illness     Thomas Kelley is a 63 y.o. male with history of with borderline HLD, occasional headaches, and no other significant PMH presented to [facility] with chest pain. He was shopping at [facility] the day prior to admission when he first noted substernal CP with radiation to the arms. In all the episode lasted about an hour and improved after resting. He went on to have another 5-6 episodes of chest pain over the next 24 hours. He went to his PCP's office for evaluation, where his troponin was drawn and was elevated. He presented to the [facility] ED for further management. His ECG showed NSR without acute changes. Labs showed normal CBC and BMP and troponin of 1.05. Full dose ASA was given and IV heparin was started. He remained pain free throughout his ED stay. He was admitted for further management.  Hospital Course    1. NSTEMI felt possibly due to coronary vasospasm - follow-up troponin was 1.53. Cardiac cath 01/07/19 showed only 30% ostial-prox RCA. There was no evidence of disease in LM, LCx, LAD. There were no focal lesions that were felt to represent ulcerated plaques or significant obstruction. This raised the question of whether this represented variant angina in the form of coronary vasospasm. 2D echo showed EF 60-65%, unrevealing otherwise. He has remained pain free since admission and has no other signs/sx for  concern for alternative causes such as PE, myocarditis, or drug-induced event. Given elevation of troponin, Dr. Angelena Form felt it was reasonable to treat with DAPT with ASA and Plavix for at least six months. He was on beta blocker during admission but this was changed to amlodipine (low dose) for anti-spasm effect. The patient had a headache later in admission so nitrate was  avoided.  2. Elevated BP without dx of HTN -he was treated with beta blocker prior to elucidation of his coronary arteries. This was changed to low dose amlodipine in case of spasm. Follow with addition of amlodipine.  3. HLD -Crestor was increased on admission, LDL 110 (just prescribed recently). Cathing note says continue high intensity statin but patient reported significant nausea this morning. Since this can be side effect of Crestor will lower dose to 20mg  daily for now. If the patient is tolerating statin at time of follow-up appointment, would consider rechecking liver function/lipid panel in 6-8 weeks.  4. Headache/nausea  - patient developed headache yesterday evening without other focal symptoms. Pt reports at home he takes Excedrin for this type of headache typically with relief. He is already on the ASA component and took Tylenol last night without relief. He also had nausea with this. He was treated with Zofran, ibuprofen (coupled with Protonix) with improvement. See below for pt instructions re: NSAIDS/excedrin.  Dr. Percival Spanish has seen and examined the patient today and feels he is stable for discharge. 2 week TOC f/u has been arranged. He will use the [facility] pharmacy at discharge.  _____________  Discharge Vitals Blood pressure 126/78, pulse 70, temperature 98.2 F (36.8 C), temperature source Oral, resp. rate 17, height 5\' 6"  (1.676 m), weight 51.8 kg, SpO2 100 %.  Filed Weights   01/06/19 2309 01/07/19 0540 01/08/19 0513  Weight: 53 kg 52.6 kg 51.8 kg    Labs & Radiologic Studies    CBC Recent Labs    01/06/19 2045 01/07/19 0322 01/08/19 0157  WBC 9.6 9.0 7.1  NEUTROABS 7.4  --   --   HGB 14.4 14.5 13.9  HCT 42.2 40.8 40.5  MCV 95.0 93.6 94.6  PLT 198 211 811   Basic Metabolic Panel Recent Labs    01/07/19 0322 01/08/19 0157  NA 140 138  K 3.8 4.0  CL 107 110  CO2 24 22  GLUCOSE 92 97  BUN 9 14  CREATININE 0.74 0.77  CALCIUM 9.0 8.8*   Cardiac  Enzymes Recent Labs    01/07/19 0322  TROPONINI 1.53*   Hemoglobin A1C Recent Labs    01/07/19 0322  HGBA1C 5.2   Fasting Lipid Panel Recent Labs    01/07/19 0322  CHOL 175  HDL 56  LDLCALC 110*  TRIG 45  CHOLHDL 3.1  _____________  Dg Chest 2 View  Result Date: 01/06/2019 CLINICAL DATA:  Chest pain beginning yesterday. EXAM: CHEST - 2 VIEW COMPARISON:  None. FINDINGS: Heart size is normal. There is no edema or effusion. The lungs are hyperinflated. No focal airspace disease is present. The visualized soft tissues and bony thorax are unremarkable. IMPRESSION: 1. No acute cardiopulmonary disease. 2. Hyperinflation of both lungs without focal airspace disease. Electronically Signed   By: San Morelle M.D.   On: 01/06/2019 19:28   Disposition   Pt is being discharged home today in good condition.  Follow-up Plans & Appointments    Follow-up Information    Lendon Colonel, NP Follow up.   Specialties:  Nurse Practitioner, Radiology, Cardiology Why:  [place] location - 01/22/19 with appointment details as below. Curt Bears is one of our nurse practitioners that works closely with Dr. Percival Spanish to help ensure a smooth transition out of the hospital. Contact information: [address] [place] [address] [phone]        Lorene Dy, MD Follow up.   Specialty:  Internal Medicine Why:  Please follow up with your primary care doctor for your history of headaches. Contact information: [place], [place] [address] [phone]          Discharge Instructions    Amb Referral to Cardiac Rehabilitation   Complete by:  As directed    Diagnosis:  NSTEMI   Diet - low sodium heart healthy   Complete by:  As directed    Discharge instructions   Complete by:  As directed    You were started on the blood thinners aspirin and clopidogrel (Plavix) due to your bloodwork showing evidence of a small heart attack. Dr.  Percival Spanish anticipates you'll be on the combination for about 6 months, aspirin possibly longer. Please discuss at your follow-up visit.  Low dose amlodipine was started as this helps prevent heart arteries from spasming or clamping down.  Nitroglycerin was prescribed as-needed for recurrent chest pain, every 5 minutes up to 3 doses. If you are having to take the 3rd dose, you should call 911.  Your Crestor (rosuvastatin) was increased to 20mg  daily. If you have your 10mg  tablets at home, you may use them up by taking 2 tablets daily (to equal 20mg ) until they're gone, then start your new prescription of the higher dose.  Patients taking blood thinners should generally limit medicines like ibuprofen, Advil, Motrin, naproxen, and Aleve due to risk of stomach bleeding. You can take these sparingly, but talk to your primary doctor about alternatives. Tylenol does not carry this same risk.  Since you are on 2 blood thinners, you should avoid some formulations of Excedrin as it also contains active aspirin.   Increase activity slowly   Complete by:  As directed    No driving for 1 week. No lifting over 10 lbs for 2 weeks. No sexual activity for 2 weeks. Keep procedure site clean & dry. If you notice increased pain, swelling, bleeding or pus, call/return!  You may shower, but no soaking baths/hot tubs/pools for 1 week.      Discharge Medications   Allergies as of 01/08/2019   No Known Allergies     Medication List    STOP taking these medications   EXCEDRIN EXTRA STRENGTH PO   ibuprofen 200 MG tablet Commonly known as:  ADVIL,MOTRIN     TAKE these medications   amLODipine 2.5 MG tablet Commonly known as:  NORVASC Take 1 tablet (2.5 mg total) by mouth daily. Start taking on:  January 09, 2019   aspirin 81 MG EC tablet Take 1 tablet (81 mg total) by mouth daily. Start taking on:  January 09, 2019   clopidogrel 75 MG tablet Commonly known as:  PLAVIX Take 1 tablet (75 mg total) by mouth  daily. Start taking on:  January 09, 2019   nitroGLYCERIN 0.4 MG SL tablet Commonly known as:  NITROSTAT Place 1 tablet (0.4 mg total) under the tongue every 5 (five) minutes as needed for chest pain (up to 3 doses).   rosuvastatin 20 MG tablet Commonly known as:  CRESTOR Take 1 tablet (20 mg total) by mouth daily. What changed:  medication strength  how much to take   vitamin C 1000 MG tablet Take 1,000 mg daily by mouth.        Allergies:  No Known Allergies  Aspirin prescribed at discharge?  Yes High Intensity Statin Prescribed? (Lipitor 40-80mg  or Crestor 20-40mg ): Yes Beta Blocker Prescribed? No: not traditional NSTEMI due to plaque -> coronary vasospasm, requiring initiation of amlodipine to start instead For EF <40%, was ACEI/ARB Prescribed? No: n/a ADP Receptor Inhibitor Prescribed? (i.e. Plavix etc.-Includes Medically Managed Patients): Yes For EF <40%, Aldosterone Inhibitor Prescribed? No: n/a Was EF assessed during THIS hospitalization? Yes Was Cardiac Rehab II ordered? (Included Medically managed Patients): cardiac rehab will see and discuss   Outstanding Labs/Studies   If the patient is tolerating statin at time of follow-up appointment, would consider rechecking liver function/lipid panel in 6-8 weeks.   Duration of Discharge Encounter   Greater than 30 minutes including physician time.  Signed, Charlie Pitter PA-C 01/08/2019, 12:58 PM

## 2019-01-08 NOTE — Progress Notes (Signed)
Per d/w Dr. Percival Spanish, stop BB and start amlodipine for presumed spasm - got med today already so will start amlodipine tomorrow Melina Copa PA-C

## 2019-01-09 NOTE — Telephone Encounter (Signed)
Attempted to contact patient regarding TOC call.  The number is not available. Will try again.

## 2019-01-10 ENCOUNTER — Telehealth (HOSPITAL_COMMUNITY): Payer: Self-pay

## 2019-01-10 NOTE — Telephone Encounter (Signed)
Pt insurance is active and benefits verified through [employer] Co-pay 0, DED 0/0 met, out of pocket $5,000/$192.49 met, co-insurance 20%. no pre-authorization required. Passport, 01/10/2019 @ 11:40am , REF# [id number]  Will contact patient to see if he is interested in the Cardiac Rehab Program. If interested, patient will need to complete follow up appt. Once completed, patient will be contacted for scheduling upon review by the RN Navigator. Tedra Senegal. Support Rep II

## 2019-01-14 NOTE — Telephone Encounter (Signed)
Patient return called-- patient states  The mobile number is no longer available- REMVE FROM CHART  Patient contacted regarding discharge from [place] on 01/08/19 .  Patient understands to follow up with provider K lawrence DNP on 3/18 /20at 9:30 AM at [facility]. Patient understands discharge instructions? yes  Patient understands medications and regiment? yes  Patient understands to bring all medications to this visit? yes

## 2019-01-14 NOTE — Telephone Encounter (Signed)
No answer at mobile - not able to connect. Try a number found in care everywhere - left message toc call back

## 2019-01-21 ENCOUNTER — Telehealth: Payer: Self-pay

## 2019-01-21 NOTE — Progress Notes (Signed)
Cardiology Office Note   Date:  01/22/2019   ID:  Thomas Kelley, DOB 13-Jun-1956, MRN [id number]  PCP:  Thomas Dy, MD  Cardiologist:  Dr. Percival Kelley   Chief Complaint  Patient presents with  . Follow-up    pt states he feels almost back to normal; states he also is trying to work on getting endurance back up. He is not getting abnormally SOB, just knowing when he needs to rest. No other Sx.  . Chest Pain     History of Present Illness: Thomas Kelley is a 63 y.o. male who presents for post hospital follow up after admission for chest pain which occurred 5-6 times, lasting an hour and improved with rest. He was sent to [facility] ED on 01/06/2019 when he was seen by his PCP for these symptoms.   He was diagnosed with NSTEMI with troponin elevation of 1.53 at peak. He subsequently underwent cardiac cath on 01/07/2019 which showed only 30% ostial-prox RCA, with no evidence of disease in the LM, LCx or LAD. 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 Was, however, placed on DAPT with ASA and Plavix for at least 6 months.He was placed on amlodipine for anti-spasm properties. He was started on low dose Crestor 20 mg daily.   He comes today with a list of questions. He denies recurrent chest pain, but admits when he is anxious, (worries about COVID 19), he feels fullness in his chest.   Past Medical History:  Diagnosis Date  . Elevated blood pressure reading without diagnosis of hypertension   . Hyperlipidemia    borderline  . NSTEMI (non-ST elevated myocardial infarction) (Thomas Kelley)    a. ? due to vasospasm - 12/2018 cath only 30% prox RCA.     Past Surgical History:  Procedure Laterality Date  . COLONOSCOPY    . LEFT HEART CATH AND CORONARY ANGIOGRAPHY N/A 01/07/2019   Procedure: LEFT HEART CATH AND CORONARY ANGIOGRAPHY;  Surgeon: Thomas Blanks, MD;  Location: [place];  Service: Cardiovascular;  Laterality: N/A;  . POLYPECTOMY    .  TOOTH EXTRACTION     oral surgeon  . WISDOM TOOTH EXTRACTION  1982     Current Outpatient Medications  Medication Sig Dispense Refill  . amLODipine (NORVASC) 2.5 MG tablet Take 1 tablet (2.5 mg total) by mouth daily. 90 tablet 1  . Ascorbic Acid (VITAMIN C) 1000 MG tablet Take 1,000 mg daily by mouth.    Marland Kitchen aspirin EC 81 MG EC tablet Take 1 tablet (81 mg total) by mouth daily. 30 tablet 6  . clopidogrel (PLAVIX) 75 MG tablet Take 1 tablet (75 mg total) by mouth daily. 90 tablet 1  . nitroGLYCERIN (NITROSTAT) 0.4 MG SL tablet Place 1 tablet (0.4 mg total) under the tongue every 5 (five) minutes as needed for chest pain (up to 3 doses). 25 tablet 3  . rosuvastatin (CRESTOR) 20 MG tablet Take 1 tablet (20 mg total) by mouth daily. 90 tablet 1   No current facility-administered medications for this visit.     Allergies:   Patient has no known allergies.    Social History:  The patient  reports that he has never smoked. He has never used smokeless tobacco. He reports current alcohol use of about 4.0 standard drinks of alcohol per week. He reports that he does not use drugs.   Family History:  The patient's family history includes Stomach cancer (age of  onset: 33) in his father.    ROS: All other systems are reviewed and negative. Unless otherwise mentioned in H&P    PHYSICAL EXAM: VS:  BP 124/88   Pulse 71   Ht 5\' 6"  (1.676 m)   Wt 118 lb (53.5 kg)   BMI 19.05 kg/m  , BMI Body mass index is 19.05 kg/m. GEN: Well nourished, well developed, in no acute distress HEENT: normal Neck: no JVD, carotid bruits, or masses Cardiac: RRR; no murmurs, rubs, or gallops,no edema  Respiratory:  Clear to auscultation bilaterally, normal work of breathing GI: soft, nontender, nondistended, + BS MS: no deformity or atrophy Skin: warm and dry, no rash Neuro:  Strength and sensation are intact Psych: euthymic mood, full affect   EKG: NSR rate of 71 bpm  Recent Labs: 01/08/2019: BUN 14;  Creatinine, Ser 0.77; Hemoglobin 13.9; Platelets 203; Potassium 4.0; Sodium 138    Lipid Panel    Component Value Date/Time   CHOL 175 01/07/2019 0322   TRIG 45 01/07/2019 0322   HDL 56 01/07/2019 0322   CHOLHDL 3.1 01/07/2019 0322   VLDL 9 01/07/2019 0322   LDLCALC 110 (H) 01/07/2019 0322      Wt Readings from Last 3 Encounters:  01/22/19 118 lb (53.5 kg)  01/08/19 114 lb 3.2 oz (51.8 kg)  10/12/17 122 lb (55.3 kg)     Other studies Reviewed: Cardiac Cath   Ost RCA to Prox RCA lesion is 30% stenosed.   1. No evidence of disease in the left main, Circumflex or LAD 2. The RCA is a large dominant artery. One view shows what appears to be non-obstructive plaque in the proximal vessel just beyond the ostium. There is no dampening of the catheter with engagement of the vessel. Good reflux back into the aorta. This stenosis appears to be mild and not obstructive.  3. No obvious culprits lesions are seen on angiography  Recommendations: No focal lesions that are felt to represent ulcerated plaques or significant obstruction. He was admitted with a classic story of unstable angina and had mild elevation of troponin. Follow up on echo. Consideration for coronary vasospasm.  Given elevation of troponin, I think it is reasonable to treat with DAPT with ASA and Plavix for at least six months. Would continue high intensity statin and beta blocker. Ambulate and if subsequent chest pain, consider adding a long acting nitrate.   Echocardiogram 01/07/2019  1. The left ventricle has normal systolic function with an ejection fraction of 60-65%. The cavity size was normal. Left ventricular diastolic parameters were normal.  2. The right ventricle has normal systolic function. The cavity was normal. There is no increase in right ventricular wall thickness.  3. The mitral valve is normal in structure.  4. The tricuspid valve is normal in structure.  5. The aortic valve is normal in structure.  6. The  pulmonic valve was normal in structure.  ASSESSMENT AND PLAN:  1. CAD: Non-obstructive per cardiac cath continue secondary prevention with statin and BP control.   2. Hypercholesterolemia: He will continue Crestor. Will repeat his lipids and LFT's in 6 weeks.    Current medicines are reviewed at length with the patient today.    Labs/ tests ordered today include: Lipids and LFT's  Phill Myron. [place], ANP, [facility]   01/22/2019 10:24 AM    [facility] [place] [address] Office [phone] Fax [phone]

## 2019-01-21 NOTE — Telephone Encounter (Signed)
NOT NEEDED

## 2019-01-22 ENCOUNTER — Ambulatory Visit: Payer: BLUE CROSS/BLUE SHIELD | Admitting: Adult Health

## 2019-01-22 ENCOUNTER — Other Ambulatory Visit: Payer: Self-pay

## 2019-01-22 ENCOUNTER — Encounter: Payer: Self-pay | Admitting: Adult Health

## 2019-01-22 VITALS — BP 124/88 | HR 71 | Ht 66.0 in | Wt 118.0 lb

## 2019-01-22 DIAGNOSIS — I2510 Atherosclerotic heart disease of native coronary artery without angina pectoris: Secondary | ICD-10-CM

## 2019-01-22 DIAGNOSIS — E7800 Pure hypercholesterolemia, unspecified: Secondary | ICD-10-CM | POA: Diagnosis not present

## 2019-01-22 DIAGNOSIS — I214 Non-ST elevation (NSTEMI) myocardial infarction: Secondary | ICD-10-CM | POA: Diagnosis not present

## 2019-01-22 DIAGNOSIS — Z79899 Other long term (current) drug therapy: Secondary | ICD-10-CM

## 2019-01-22 MED ORDER — AMLODIPINE BESYLATE 2.5 MG PO TABS: 2.5000 mg | ORAL_TABLET | Freq: Every day | ORAL | 1 refills | Status: DC

## 2019-01-22 MED ORDER — CLOPIDOGREL BISULFATE 75 MG PO TABS: 75.0000 mg | ORAL_TABLET | Freq: Every day | ORAL | 1 refills | Status: DC

## 2019-01-22 MED ORDER — ROSUVASTATIN CALCIUM 20 MG PO TABS: 20.0000 mg | ORAL_TABLET | Freq: Every day | ORAL | 1 refills | Status: DC

## 2019-01-22 NOTE — Patient Instructions (Addendum)
Follow-Up: You will need a follow up appointment in 3 months.  You may see Minus Breeding, MD , Jory Sims, DNP, [place] or one of the following Advanced Practice Providers on your designated Care Team:   Jory Sims, DNP, AACC  Rhonda Barrett, PA-C  Lab: IN 6 WEEKS (03-05-2019) LFT AND LIPID PANEL HERE IN OUR OFFICE AT [facility]  Medication Instructions:  NO CHANGES- Your physician recommends that you continue on your current medications as directed. Please refer to the Current Medication list given to you today. If you need a refill on your cardiac medications before your next appointment, please call your pharmacy. Labwork: When you have labs (blood work) and your tests are completely normal, you will receive your results ONLY by [facility] (if you have MyChart) -OR- A paper copy in the mail.  At [facility], you and your health needs are our priority.  As part of our continuing mission to provide you with exceptional heart care, we have created designated Provider Care Teams.  These Care Teams include your primary Cardiologist (physician) and Advanced Practice Providers (APPs -  Physician Assistants and Nurse Practitioners) who all work together to provide you with the care you need, when you need it.  Thank you for choosing [facility] at [facility]!!

## 2019-01-24 NOTE — Telephone Encounter (Signed)
Attempted to call patient in regards to Cardiac Rehab - to let pt know we are closed at this time due to the COVID-19 and will contact once we have resume scheduling. LMTCB °Gloria W. Support Rep II ° °

## 2019-03-05 LAB — LIPID PANEL
Chol/HDL Ratio: 2.2 ratio (ref 0.0–5.0)
Cholesterol, Total: 134 mg/dL (ref 100–199)
HDL: 60 mg/dL (ref >39)
LDL Calculated: 59 mg/dL (ref 0–99)
TRIGLYCERIDES: 74 mg/dL (ref 0–149)
VLDL Cholesterol Cal: 15 mg/dL (ref 5–40)

## 2019-03-05 LAB — HEPATIC FUNCTION PANEL
ALT: 47 IU/L — ABNORMAL HIGH (ref 0–44)
AST: 36 IU/L (ref 0–40)
Albumin: 4.7 g/dL (ref 3.8–4.8)
Alkaline Phosphatase: 82 IU/L (ref 39–117)
Bilirubin Total: 0.7 mg/dL (ref 0.0–1.2)
Bilirubin, Direct: 0.2 mg/dL (ref 0.00–0.40)
Total Protein: 6.8 g/dL (ref 6.0–8.5)

## 2019-03-06 ENCOUNTER — Telehealth (HOSPITAL_COMMUNITY): Payer: Self-pay

## 2019-03-22 ENCOUNTER — Telehealth: Payer: Self-pay | Admitting: Cardiology

## 2019-03-22 NOTE — Telephone Encounter (Signed)
Patient called stating that he has been having chest pain that he describes as pleuritic in nature.  It is tight and not sharp but only occurs with deep breathing.  He also is have some low back pain with movement that started today as well and gets worse with deep breathing. He thinks he over did it yesterday.  He was working out in the yard yesterday all day and also using a drill doing some heavy drilling.  He says that he took 3 SL NTG which did nothing.  Instructed to take tylenol to see if he has an improvement.  Instructed to avoid NSAIDs due to Plavix and ASA.  He will call back in a while if sx don't improve. Of note he had a cath in March that showed only a 30% RCA stenosis.

## 2019-04-14 NOTE — Progress Notes (Signed)
Virtual Visit via Video Note   This visit type was conducted due to national recommendations for restrictions regarding the COVID-19 Pandemic (e.g. social distancing) in an effort to limit this patient's exposure and mitigate transmission in our community.  Due to his co-morbid illnesses, this patient is at least at moderate risk for complications without adequate follow up.  This format is felt to be most appropriate for this patient at this time.  All issues noted in this document were discussed and addressed.  A limited physical exam was performed with this format.  Please refer to the patient's chart for his consent to telehealth for [facility].   Date:  04/15/2019   ID:  Thomas Kelley, DOB 1955/11/14, MRN [id number]  Patient Location: Home Provider Location: Home  PCP:  Lorene Dy, MD  Cardiologist:  Minus Breeding, MD  Electrophysiologist:  None   Evaluation Performed:  Follow-Up Visit  Chief Complaint:  Chest pain  History of Present Illness:    Thomas Kelley is a 63 y.o. male for follow up of NSTEMI with troponin elevation of 1.53 at peak. He subsequently underwent cardiac cath on 01/07/2019 which showed only 30% ostial-prox RCA, with no evidence of disease in the LM, LCx or LAD. 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 He was however, placed on DAPT with ASA and Plavix for at least 6 months.He was placed on amlodipine for anti-spasm properties. He was started on low dose Crestor 20 mg daily.    Since he was seen he has had no further chest discomfort.  He denies any new shortness of breath, PND or orthopnea.  He is had no weight gain or edema.  He took some nitroglycerin because he thought his breathing was a little off but it did not make a difference.  He has not had any of the severe symptoms that prompted his admission.  He is walking 2 miles a day.  The patient does not have symptoms concerning for COVID-19 infection  (fever, chills, cough, or new shortness of breath).    Past Medical History:  Diagnosis Date  . Elevated blood pressure reading without diagnosis of hypertension   . Hyperlipidemia    borderline  . NSTEMI (non-ST elevated myocardial infarction) ([place])    a. ? due to vasospasm - 12/2018 cath only 30% prox RCA.    Past Surgical History:  Procedure Laterality Date  . COLONOSCOPY    . LEFT HEART CATH AND CORONARY ANGIOGRAPHY N/A 01/07/2019   Procedure: LEFT HEART CATH AND CORONARY ANGIOGRAPHY;  Surgeon: Burnell Blanks, MD;  Location: [place] [facility];  Service: Cardiovascular;  Laterality: N/A;  . POLYPECTOMY    . TOOTH EXTRACTION     oral surgeon  . Many     Prior to Admission medications   Medication Sig Start Date End Date Taking? Authorizing Provider  amLODipine (NORVASC) 2.5 MG tablet Take 1 tablet (2.5 mg total) by mouth daily. 01/22/19  Yes Lendon Colonel, NP  Ascorbic Acid (VITAMIN C) 1000 MG tablet Take 1,000 mg daily by mouth.   Yes [provider]  aspirin EC 81 MG EC tablet Take 1 tablet (81 mg total) by mouth daily. 01/09/19  Yes Dunn, Dayna N, PA-C  clopidogrel (PLAVIX) 75 MG tablet Take 1 tablet (75 mg total) by mouth daily. 01/22/19  Yes Lendon Colonel, NP  nitroGLYCERIN (NITROSTAT) 0.4 MG SL tablet Place 1  tablet (0.4 mg total) under the tongue every 5 (five) minutes as needed for chest pain (up to 3 doses). 01/08/19  Yes Dunn, Dayna N, PA-C  rosuvastatin (CRESTOR) 20 MG tablet Take 1 tablet (20 mg total) by mouth daily. 01/22/19  Yes Lendon Colonel, NP     Allergies:   Patient has no known allergies.   Social History   Tobacco Use  . Smoking status: Never Smoker  . Smokeless tobacco: Never Used  Substance Use Topics  . Alcohol use: Yes    Alcohol/week: 4.0 standard drinks    Types: 4 Cans of beer per week    Comment: rare  . Drug use: No     Family Hx: The patient's family history includes Stomach cancer  (age of onset: 23) in his father. There is no history of Colon cancer, Colon polyps, Esophageal cancer, or Rectal cancer.  ROS:   Please see the history of present illness.    As stated in the HPI and negative for all other systems.   Prior CV studies:   The following studies were reviewed today:  Cath  Labs/Other Tests and Data Reviewed:    EKG:  No ECG reviewed.  Recent Labs: 01/08/2019: BUN 14; Creatinine, Ser 0.77; Hemoglobin 13.9; Platelets 203; Potassium 4.0; Sodium 138 03/05/2019: ALT 47   Recent Lipid Panel Lab Results  Component Value Date/Time   CHOL 134 03/05/2019 08:08 AM   TRIG 74 03/05/2019 08:08 AM   HDL 60 03/05/2019 08:08 AM   CHOLHDL 2.2 03/05/2019 08:08 AM   CHOLHDL 3.1 01/07/2019 03:22 AM   LDLCALC 59 03/05/2019 08:08 AM    Wt Readings from Last 3 Encounters:  04/15/19 112 lb (50.8 kg)  01/22/19 118 lb (53.5 kg)  01/08/19 114 lb 3.2 oz (51.8 kg)     Objective:    Vital Signs:  Pulse 84   Ht 5\' 6"  (1.676 m)   Wt 112 lb (50.8 kg)   BMI 18.08 kg/m    VITAL SIGNS:  reviewed GEN:  no acute distress EYES:  sclerae anicteric, EOMI - Extraocular Movements Intact NEURO:  alert and oriented x 3, no obvious focal deficit PSYCH:  normal affect  ASSESSMENT & PLAN:    CAD:  He has very mild coronary artery disease.  He can continue with risk reduction.  He can stop his Plavix.  I would continue low-dose aspirin.  HYPERLIPIDEMIA:   He had an excellent response to starting Crestor.  Previously his total cholesterol been 218 with an LDL of 139.  His LDL is now 59.  He should continue the statins as listed.   Time:   Today, I have spent 19 minutes with the patient with telehealth technology discussing the above problems.     Medication Adjustments/Labs and Tests Ordered: Current medicines are reviewed at length with the patient today.  Concerns regarding medicines are outlined above.   Tests Ordered: No orders of the defined types were placed in this  encounter.   Medication Changes: No orders of the defined types were placed in this encounter.   Disposition:  Follow up as needed  Signed, Minus Breeding, MD  04/15/2019 10:34 AM    [place]

## 2019-04-15 ENCOUNTER — Encounter: Payer: Self-pay | Admitting: Cardiology

## 2019-04-15 ENCOUNTER — Telehealth (INDEPENDENT_AMBULATORY_CARE_PROVIDER_SITE_OTHER): Payer: BLUE CROSS/BLUE SHIELD | Admitting: Cardiology

## 2019-04-15 VITALS — HR 84 | Ht 66.0 in | Wt 112.0 lb

## 2019-04-15 DIAGNOSIS — I214 Non-ST elevation (NSTEMI) myocardial infarction: Secondary | ICD-10-CM

## 2019-04-15 NOTE — Patient Instructions (Signed)
Medication Instructions:  STOP- Plavix  If you need a refill on your cardiac medications before your next appointment, please call your pharmacy.  Labwork: None Ordered   Testing/Procedures: None Ordered  Follow-Up:  . Your physician recommends that you schedule a follow-up appointment in: As Needed   At [facility], you and your health needs are our priority.  As part of our continuing mission to provide you with exceptional heart care, we have created designated Provider Care Teams.  These Care Teams include your primary Cardiologist (physician) and Advanced Practice Providers (APPs -  Physician Assistants and Nurse Practitioners) who all work together to provide you with the care you need, when you need it.  Thank you for choosing [facility] at [facility]!!

## 2019-04-17 ENCOUNTER — Telehealth: Payer: BLUE CROSS/BLUE SHIELD | Admitting: Cardiology

## 2019-04-23 ENCOUNTER — Encounter (HOSPITAL_COMMUNITY): Payer: Self-pay

## 2019-04-23 ENCOUNTER — Telehealth (HOSPITAL_COMMUNITY): Payer: Self-pay

## 2019-04-23 NOTE — Telephone Encounter (Signed)
Attempted to call patient in regards to our Virtual Cardiac Rehab - LM on VM

## 2019-04-29 ENCOUNTER — Telehealth (HOSPITAL_COMMUNITY): Payer: Self-pay

## 2019-04-29 NOTE — Telephone Encounter (Signed)
Return pt phone call, no answer left voicemail.

## 2019-04-29 NOTE — Telephone Encounter (Signed)
Pt returned CR phone call and stated he is exercising at home. Would call at a later date if he would like to participate.  Closed referral

## 2019-04-30 IMAGING — DX DG CHEST 2V
2 series · 2 of 2 positions shown · non-contrast
Comparison: None.

CLINICAL DATA: Chest pain beginning yesterday.

EXAM:
CHEST - 2 VIEW

[chest lat]
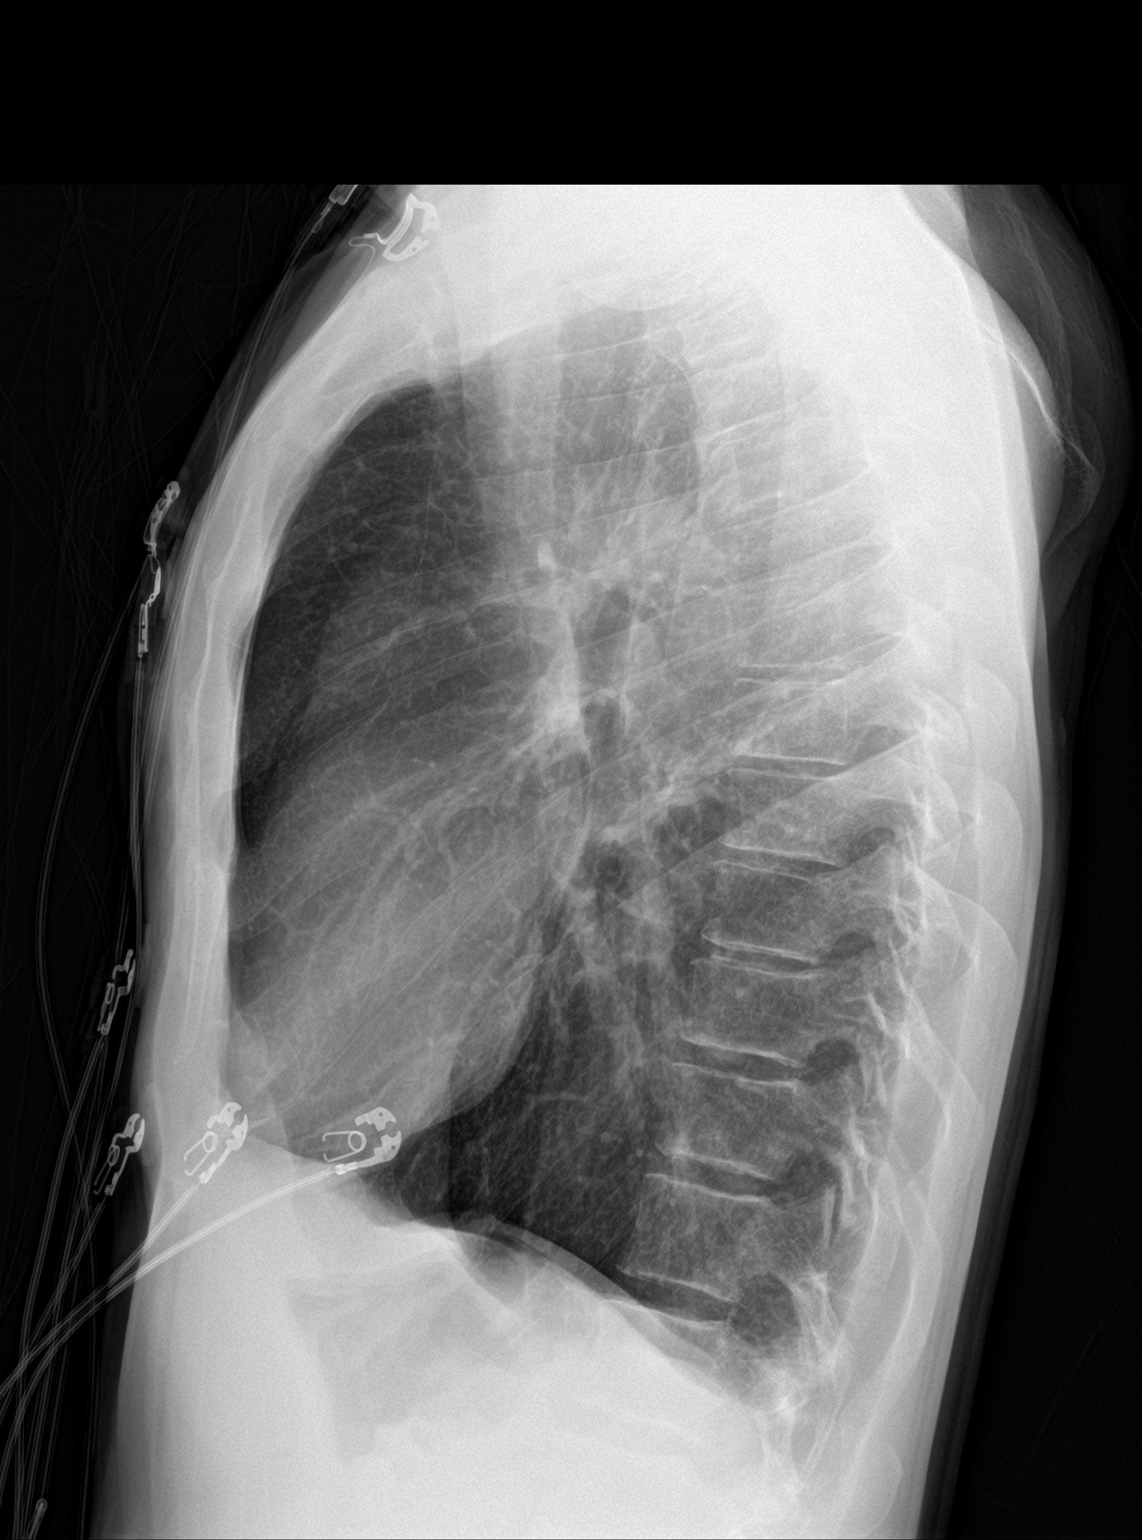

[chest pa]
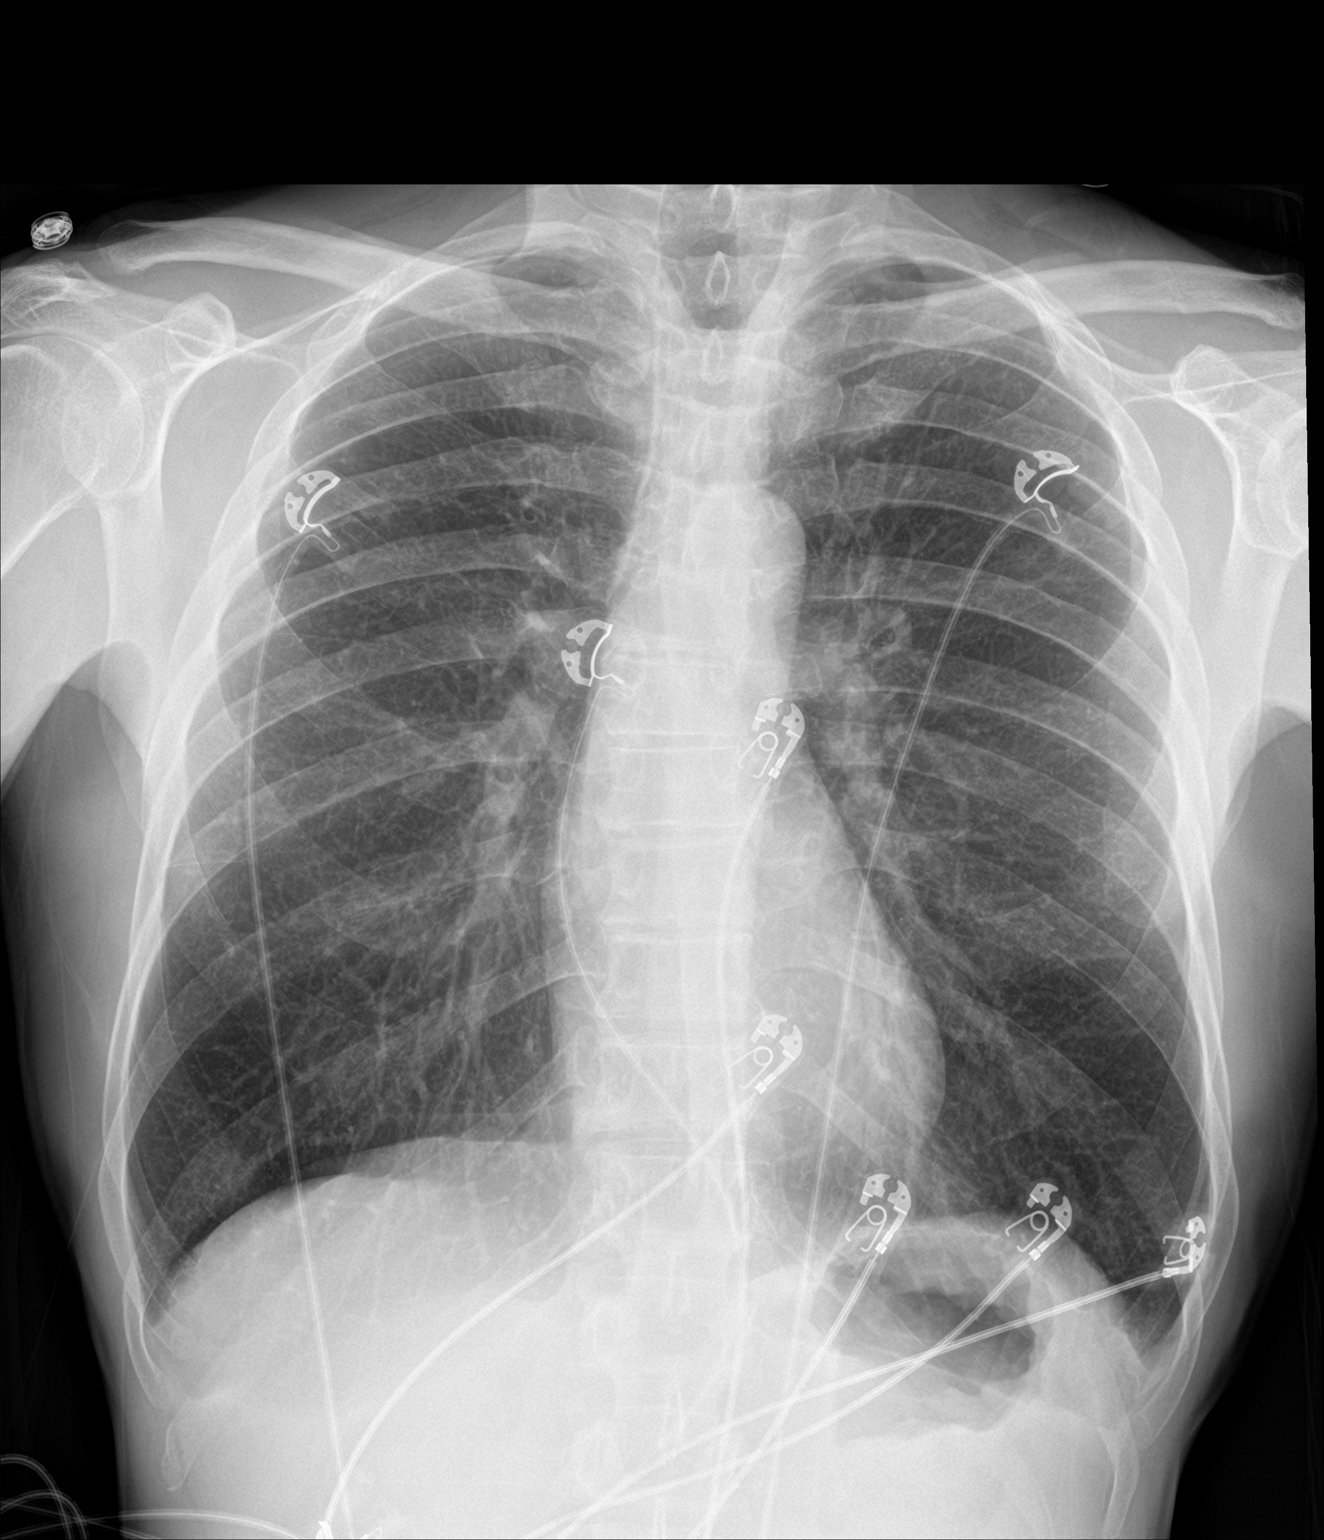

[2 of 2 positions shown; findings below may reference images not displayed]

FINDINGS: Heart size is normal. There is no edema or effusion. The lungs are
hyperinflated. No focal airspace disease is present. The visualized
soft tissues and bony thorax are unremarkable.
IMPRESSION: 1. No acute cardiopulmonary disease.
2. Hyperinflation of both lungs without focal airspace disease.

## 2019-06-01 ENCOUNTER — Emergency Department (HOSPITAL_COMMUNITY)
Admission: EM | Admit: 2019-06-01 | Discharge: 2019-06-01 | Disposition: A | Discharge status: Home / Self Care | Payer: BLUE CROSS/BLUE SHIELD | Attending: Emergency Medicine | Admitting: Emergency Medicine

## 2019-06-01 ENCOUNTER — Other Ambulatory Visit: Payer: Self-pay

## 2019-06-01 ENCOUNTER — Encounter (HOSPITAL_COMMUNITY): Payer: Self-pay | Admitting: Emergency Medicine

## 2019-06-01 ENCOUNTER — Emergency Department (HOSPITAL_COMMUNITY): Payer: BLUE CROSS/BLUE SHIELD

## 2019-06-01 DIAGNOSIS — R0602 Shortness of breath: Secondary | ICD-10-CM | POA: Insufficient documentation

## 2019-06-01 DIAGNOSIS — R6883 Chills (without fever): Secondary | ICD-10-CM | POA: Insufficient documentation

## 2019-06-01 DIAGNOSIS — R5381 Other malaise: Secondary | ICD-10-CM | POA: Insufficient documentation

## 2019-06-01 DIAGNOSIS — Z79899 Other long term (current) drug therapy: Secondary | ICD-10-CM | POA: Diagnosis not present

## 2019-06-01 DIAGNOSIS — I252 Old myocardial infarction: Secondary | ICD-10-CM | POA: Insufficient documentation

## 2019-06-01 DIAGNOSIS — Z7982 Long term (current) use of aspirin: Secondary | ICD-10-CM | POA: Insufficient documentation

## 2019-06-01 DIAGNOSIS — Z20828 Contact with and (suspected) exposure to other viral communicable diseases: Secondary | ICD-10-CM | POA: Insufficient documentation

## 2019-06-01 LAB — CBC WITH DIFFERENTIAL/PLATELET
Abs Immature Granulocytes: 0.02 10*3/uL (ref 0.00–0.07)
Basophils Absolute: 0.1 10*3/uL (ref 0.0–0.1)
Basophils Relative: 1 %
Eosinophils Absolute: 0.1 10*3/uL (ref 0.0–0.5)
Eosinophils Relative: 1 %
HCT: 43.5 % (ref 39.0–52.0)
Hemoglobin: 15.2 g/dL (ref 13.0–17.0)
Immature Granulocytes: 0 %
Lymphocytes Relative: 15 %
Lymphs Abs: 1 10*3/uL (ref 0.7–4.0)
MCH: 33.6 pg (ref 26.0–34.0)
MCHC: 34.9 g/dL (ref 30.0–36.0)
MCV: 96 fL (ref 80.0–100.0)
Monocytes Absolute: 0.6 10*3/uL (ref 0.1–1.0)
Monocytes Relative: 10 %
Neutro Abs: 4.7 10*3/uL (ref 1.7–7.7)
Neutrophils Relative %: 73 %
Platelets: 201 10*3/uL (ref 150–400)
RBC: 4.53 MIL/uL (ref 4.22–5.81)
RDW: 12.5 % (ref 11.5–15.5)
WBC: 6.4 10*3/uL (ref 4.0–10.5)
nRBC: 0 % (ref 0.0–0.2)

## 2019-06-01 LAB — BASIC METABOLIC PANEL
Anion gap: 8 (ref 5–15)
BUN: 8 mg/dL (ref 8–23)
CO2: 23 mmol/L (ref 22–32)
Calcium: 9.7 mg/dL (ref 8.9–10.3)
Chloride: 109 mmol/L (ref 98–111)
Creatinine, Ser: 0.79 mg/dL (ref 0.61–1.24)
GFR calc Af Amer: >60 mL/min (ref >60)
GFR calc non Af Amer: >60 mL/min (ref >60)
Glucose, Bld: 108 mg/dL — ABNORMAL HIGH (ref 70–99)
Potassium: 3.9 mmol/L (ref 3.5–5.1)
Sodium: 140 mmol/L (ref 135–145)

## 2019-06-01 LAB — TROPONIN I (HIGH SENSITIVITY)
Troponin I (High Sensitivity): 3 ng/L (ref <18)
Troponin I (High Sensitivity): 3 ng/L (ref <18)

## 2019-06-01 MED ORDER — SODIUM CHLORIDE 0.9 % IV BOLUS
1000.0000 mL | Freq: Once | INTRAVENOUS | Status: AC
  Administered 2019-06-01: 1000 mL via INTRAVENOUS

## 2019-06-01 NOTE — ED Triage Notes (Signed)
Pt arrives to ED from home with complaints of chills and shortness of breath starting yesterday. The patient states that he feels much better this morning. Patient states he never developed a fever.

## 2019-06-01 NOTE — Discharge Instructions (Addendum)
Your COVID test has been sent out - it should take 2-3 days to come back Please isolate at home until you get your results Return if worsening

## 2019-06-01 NOTE — ED Notes (Signed)
Patient verbalizes understanding of discharge instructions. Opportunity for questioning and answers were provided. Armband removed by staff, pt discharged from ED.  

## 2019-06-01 NOTE — ED Provider Notes (Signed)
[place] EMERGENCY DEPARTMENT Provider Note   CSN: [id number] Arrival date & time: 06/01/19  1026     History   Chief Complaint Chief Complaint  Patient presents with  . Chills  . Shortness of Breath    HPI Thomas Kelley is a 63 y.o. male who presents with chills and shortness of breath.  Past medical history significant for history of NSTEMI.  The patient states that he was at a funeral yesterday.  He felt "really bad" while he was outside and actually had to leave the funeral early due to generalized malaise.  He felt better when he got into a cooler environment.  Later that evening he started to develop chills and felt flushed. He didn't have an appetite. He reports a burning sensation across his chest with intermittent shortness of breath.  The symptoms are somewhat vague and he cannot tell me any aggravating or alleviating factors.  He thinks there may be an anxiety component. Does not feel like when he had an MI.  He denies any fever.  No URI symptoms, cough, abdominal pain, nausea or vomiting.  He states he actually feels well today but decided he still come in and get checked out because his symptoms worried him. No sick contacts. He's been wearing a mask.      HPI  Past Medical History:  Diagnosis Date  . Elevated blood pressure reading without diagnosis of hypertension   . Hyperlipidemia    borderline  . NSTEMI (non-ST elevated myocardial infarction) ([place])    a. ? due to vasospasm - 12/2018 cath only 30% prox RCA.     Patient Active Problem List   Diagnosis Date Noted  . Hyperlipidemia, mild 01/08/2019  . Coronary vasospasm ([place]) 01/08/2019  . Headache 01/08/2019  . Elevated blood pressure reading without diagnosis of hypertension 01/08/2019  . NSTEMI (non-ST elevated myocardial infarction) ([place]) 01/06/2019    Past Surgical History:  Procedure Laterality Date  . COLONOSCOPY    . LEFT HEART CATH AND CORONARY ANGIOGRAPHY N/A 01/07/2019   Procedure: LEFT HEART CATH AND CORONARY ANGIOGRAPHY;  Surgeon: Burnell Blanks, MD;  Location: [place] [facility];  Service: Cardiovascular;  Laterality: N/A;  . POLYPECTOMY    . TOOTH EXTRACTION     oral surgeon  . WISDOM TOOTH EXTRACTION  1982        Home Medications    Prior to Admission medications   Medication Sig Start Date End Date Taking? Authorizing Provider  amLODipine (NORVASC) 2.5 MG tablet Take 1 tablet (2.5 mg total) by mouth daily. 01/22/19   Lendon Colonel, NP  Ascorbic Acid (VITAMIN C) 1000 MG tablet Take 1,000 mg daily by mouth.    [provider]  aspirin EC 81 MG EC tablet Take 1 tablet (81 mg total) by mouth daily. 01/09/19   Dunn, Nedra Hai, PA-C  nitroGLYCERIN (NITROSTAT) 0.4 MG SL tablet Place 1 tablet (0.4 mg total) under the tongue every 5 (five) minutes as needed for chest pain (up to 3 doses). 01/08/19   Dunn, Nedra Hai, PA-C  rosuvastatin (CRESTOR) 20 MG tablet Take 1 tablet (20 mg total) by mouth daily. 01/22/19   Lendon Colonel, NP    Family History Family History  Problem Relation Age of Onset  . Stomach cancer Father 11  . Colon cancer Neg Hx   . Colon polyps Neg Hx   . Esophageal cancer Neg Hx   . Rectal cancer Neg Hx  Social History Social History   Tobacco Use  . Smoking status: Never Smoker  . Smokeless tobacco: Never Used  Substance Use Topics  . Alcohol use: Yes    Alcohol/week: 4.0 standard drinks    Types: 4 Cans of beer per week    Comment: rare  . Drug use: No     Allergies   Patient has no known allergies.   Review of Systems Review of Systems  Constitutional: Positive for appetite change and chills. Negative for fever.  HENT: Negative for congestion.   Respiratory: Positive for shortness of breath. Negative for cough.   Cardiovascular: Positive for chest pain.  Gastrointestinal: Negative for abdominal pain, nausea and vomiting.  Neurological: Negative for headaches.  All other systems reviewed  and are negative.    Physical Exam Updated Vital Signs BP 136/85 (BP Location: Right Arm)   Pulse 94   Temp 98.2 F (36.8 C) (Oral)   Resp 14   SpO2 97%   Physical Exam Vitals signs and nursing note reviewed.  Constitutional:      General: He is not in acute distress.    Appearance: He is well-developed. He is not ill-appearing.     Comments: Calm, cooperative. Pleasant. Thin  HENT:     Head: Normocephalic and atraumatic.  Eyes:     General: No scleral icterus.       Right eye: No discharge.        Left eye: No discharge.     Conjunctiva/sclera: Conjunctivae normal.     Pupils: Pupils are equal, round, and reactive to light.  Neck:     Musculoskeletal: Normal range of motion.  Cardiovascular:     Rate and Rhythm: Normal rate and regular rhythm.  Pulmonary:     Effort: Pulmonary effort is normal. No respiratory distress.     Breath sounds: Normal breath sounds.  Abdominal:     General: There is no distension.     Palpations: Abdomen is soft.     Tenderness: There is no abdominal tenderness.  Musculoskeletal:     Right lower leg: No edema.     Left lower leg: No edema.  Skin:    General: Skin is warm and dry.  Neurological:     Mental Status: He is alert and oriented to person, place, and time.  Psychiatric:        Behavior: Behavior normal.      ED Treatments / Results  Labs (all labs ordered are listed, but only abnormal results are displayed) Labs Reviewed  BASIC METABOLIC PANEL - Abnormal; Notable for the following components:      Result Value   Glucose, Bld 108 (*)    All other components within normal limits  NOVEL CORONAVIRUS, NAA (HOSPITAL ORDER, SEND-OUT TO REF LAB)  CBC WITH DIFFERENTIAL/PLATELET  TROPONIN I (HIGH SENSITIVITY)  TROPONIN I (HIGH SENSITIVITY)    EKG EKG Interpretation  Date/Time:  Sunday June 01 2019 10:34:15 EDT Ventricular Rate:  102 PR Interval:  140 QRS Duration: 92 QT Interval:  328 QTC Calculation: 427 R Axis:    82 Text Interpretation:  Sinus tachycardia Right atrial enlargement Borderline ECG Confirmed by Pattricia Boss [phone]) on 06/01/2019 1:53:53 PM   Radiology Dg Chest Port 1 View  Result Date: 06/01/2019 CLINICAL DATA:  Acute shortness of breath EXAM: PORTABLE CHEST 1 VIEW COMPARISON:  01/06/2019 chest radiograph FINDINGS: The cardiomediastinal silhouette is unremarkable. There is no evidence of focal airspace disease, pulmonary edema, suspicious pulmonary nodule/mass, pleural effusion, or  pneumothorax. No acute bony abnormalities are identified. IMPRESSION: No active disease. Electronically Signed   By: Margarette Canada M.D.   On: 06/01/2019 11:38    Procedures Procedures (including critical care time)  Medications Ordered in ED Medications  sodium chloride 0.9 % bolus 1,000 mL (0 mLs Intravenous Stopped 06/01/19 1315)     Initial Impression / Assessment and Plan / ED Course  I have reviewed the triage vital signs and the nursing notes.  Pertinent labs & imaging results that were available during my care of the patient were reviewed by me and considered in my medical decision making (see chart for details).  63 year old male presents with chills, hot flashes, shortness of breath.  He also has some vague chest burning sensation yesterday.  His vital signs are normal here.  He is well-appearing and he feels well.  His EKG is sinus tachycardia with right atrial enlargement.  His chest x-ray is normal.  His labs look normal.  First and second troponin are normal.  Symptoms are atypical for ACS and there are no clinical signs of PE. Shared visit with Dr. Jeanell Sparrow.  Outpatient COVID test was sent.  Patient was advised to self isolate until he gets the results.  Final Clinical Impressions(s) / ED Diagnoses   Final diagnoses:  Chills  Shortness of breath    ED Discharge Orders    None       Recardo Evangelist, PA-C 06/01/19 1423    Pattricia Boss, MD 06/01/19 [phone]

## 2019-06-02 LAB — NOVEL CORONAVIRUS, NAA (HOSP ORDER, SEND-OUT TO REF LAB; TAT 18-24 HRS): SARS-CoV-2, NAA: NOT DETECTED

## 2019-06-03 ENCOUNTER — Other Ambulatory Visit: Payer: Self-pay

## 2019-06-03 ENCOUNTER — Emergency Department (HOSPITAL_COMMUNITY)
Admission: EM | Admit: 2019-06-03 | Discharge: 2019-06-04 | Disposition: A | Discharge status: Home / Self Care | Payer: BLUE CROSS/BLUE SHIELD | Attending: Emergency Medicine | Admitting: Emergency Medicine

## 2019-06-03 ENCOUNTER — Encounter (HOSPITAL_COMMUNITY): Payer: Self-pay | Admitting: Emergency Medicine

## 2019-06-03 DIAGNOSIS — R0602 Shortness of breath: Secondary | ICD-10-CM | POA: Diagnosis not present

## 2019-06-03 DIAGNOSIS — Z79899 Other long term (current) drug therapy: Secondary | ICD-10-CM | POA: Diagnosis not present

## 2019-06-03 DIAGNOSIS — I10 Essential (primary) hypertension: Secondary | ICD-10-CM | POA: Insufficient documentation

## 2019-06-03 DIAGNOSIS — I252 Old myocardial infarction: Secondary | ICD-10-CM | POA: Insufficient documentation

## 2019-06-03 LAB — BASIC METABOLIC PANEL
Anion gap: 10 (ref 5–15)
BUN: 10 mg/dL (ref 8–23)
CO2: 23 mmol/L (ref 22–32)
Calcium: 8.8 mg/dL — ABNORMAL LOW (ref 8.9–10.3)
Chloride: 97 mmol/L — ABNORMAL LOW (ref 98–111)
Creatinine, Ser: 0.82 mg/dL (ref 0.61–1.24)
GFR calc Af Amer: >60 mL/min (ref >60)
GFR calc non Af Amer: >60 mL/min (ref >60)
Glucose, Bld: 109 mg/dL — ABNORMAL HIGH (ref 70–99)
Potassium: 3.9 mmol/L (ref 3.5–5.1)
Sodium: 130 mmol/L — ABNORMAL LOW (ref 135–145)

## 2019-06-03 LAB — CBC
HCT: 41 % (ref 39.0–52.0)
Hemoglobin: 14.3 g/dL (ref 13.0–17.0)
MCH: 33.3 pg (ref 26.0–34.0)
MCHC: 34.9 g/dL (ref 30.0–36.0)
MCV: 95.6 fL (ref 80.0–100.0)
Platelets: 204 10*3/uL (ref 150–400)
RBC: 4.29 MIL/uL (ref 4.22–5.81)
RDW: 12.2 % (ref 11.5–15.5)
WBC: 7.6 10*3/uL (ref 4.0–10.5)
nRBC: 0 % (ref 0.0–0.2)

## 2019-06-03 LAB — TROPONIN I (HIGH SENSITIVITY): Troponin I (High Sensitivity): <2 ng/L (ref <18)

## 2019-06-03 MED ORDER — SODIUM CHLORIDE 0.9% FLUSH: 3.0000 mL | Freq: Once | INTRAVENOUS | Status: DC

## 2019-06-03 NOTE — ED Notes (Signed)
Pt updated on wait times, staff apologized for long wait and encouraged pt to stay to be seen by provider, pt verbalized understanding and agreed to stay at this time

## 2019-06-03 NOTE — ED Triage Notes (Signed)
Pt reports SHOB, chills, and weakness since Saturday. Pt reports he was seen Sunday and discharged, COVID test was negative. Pt denies fever. Pt reports some chest discomfort/burning.

## 2019-06-04 ENCOUNTER — Emergency Department (HOSPITAL_COMMUNITY): Payer: BLUE CROSS/BLUE SHIELD

## 2019-06-04 LAB — TROPONIN I (HIGH SENSITIVITY): Troponin I (High Sensitivity): 3 ng/L (ref <18)

## 2019-06-04 LAB — D-DIMER, QUANTITATIVE: D-Dimer, Quant: <0.27 ug/mL-FEU (ref 0.00–0.50)

## 2019-06-04 MED ORDER — OMEPRAZOLE 20 MG PO CPDR: 20.0000 mg | DELAYED_RELEASE_CAPSULE | Freq: Every day | ORAL | 0 refills | Status: DC

## 2019-06-04 NOTE — ED Provider Notes (Signed)
[place] EMERGENCY DEPARTMENT Provider Note   CSN: [id number] Arrival date & time: 06/03/19  1703     History   Chief Complaint Chief Complaint  Patient presents with  . Weakness  . Shortness of Breath    HPI Thomas Kelley is a 63 y.o. male.     Patient with past medical history markable for hypertension, hyperlipidemia, prior end STEMI thought to be due to vasospasm, had catheterization in February 2020 showing 30% blockage of proximal RCA, presents to the emergency department with a chief complaint of intermittent episodes of chest pain.  He reports having intermittent shortness of breath and feeling weak and flushed and lightheaded.  He denies any of the symptoms now.  He was seen recently for the same and had reassuring work-up and was discharged home.  He had a negative coronavirus test at that time.  He denies having had any fever, but does report having chills and feeling flushed.  He states that sometimes he feels like he is going to pass out.  He denies any recent travel, surgery, or immobilization.  Denies any lower extremity swelling.  Denies any history of PE or DVT.  The history is provided by the patient. No language interpreter was used.    Past Medical History:  Diagnosis Date  . Elevated blood pressure reading without diagnosis of hypertension   . Hyperlipidemia    borderline  . NSTEMI (non-ST elevated myocardial infarction) ([place])    a. ? due to vasospasm - 12/2018 cath only 30% prox RCA.     Patient Active Problem List   Diagnosis Date Noted  . Hyperlipidemia, mild 01/08/2019  . Coronary vasospasm ([place]) 01/08/2019  . Headache 01/08/2019  . Elevated blood pressure reading without diagnosis of hypertension 01/08/2019  . NSTEMI (non-ST elevated myocardial infarction) ([place]) 01/06/2019    Past Surgical History:  Procedure Laterality Date  . COLONOSCOPY    . LEFT HEART CATH AND CORONARY ANGIOGRAPHY N/A 01/07/2019   Procedure: LEFT HEART  CATH AND CORONARY ANGIOGRAPHY;  Surgeon: Burnell Blanks, MD;  Location: [place] [facility];  Service: Cardiovascular;  Laterality: N/A;  . POLYPECTOMY    . TOOTH EXTRACTION     oral surgeon  . WISDOM TOOTH EXTRACTION  1982        Home Medications    Prior to Admission medications   Medication Sig Start Date End Date Taking? Authorizing Provider  amLODipine (NORVASC) 2.5 MG tablet Take 1 tablet (2.5 mg total) by mouth daily. 01/22/19   Lendon Colonel, NP  Ascorbic Acid (VITAMIN C) 1000 MG tablet Take 1,000 mg daily by mouth.    [provider]  aspirin EC 81 MG EC tablet Take 1 tablet (81 mg total) by mouth daily. 01/09/19   Dunn, Nedra Hai, PA-C  nitroGLYCERIN (NITROSTAT) 0.4 MG SL tablet Place 1 tablet (0.4 mg total) under the tongue every 5 (five) minutes as needed for chest pain (up to 3 doses). 01/08/19   Dunn, Nedra Hai, PA-C  rosuvastatin (CRESTOR) 20 MG tablet Take 1 tablet (20 mg total) by mouth daily. 01/22/19   Lendon Colonel, NP    Family History Family History  Problem Relation Age of Onset  . Stomach cancer Father 1  . Colon cancer Neg Hx   . Colon polyps Neg Hx   . Esophageal cancer Neg Hx   . Rectal cancer Neg Hx     Social History Social History   Tobacco Use  . Smoking  status: Never Smoker  . Smokeless tobacco: Never Used  Substance Use Topics  . Alcohol use: Yes    Alcohol/week: 4.0 standard drinks    Types: 4 Cans of beer per week    Comment: rare  . Drug use: No     Allergies   Patient has no known allergies.   Review of Systems Review of Systems  All other systems reviewed and are negative.    Physical Exam Updated Vital Signs BP 122/73 (BP Location: Right Arm)   Pulse 78   Temp 98.3 F (36.8 C) (Oral)   Resp 18   SpO2 100%   Physical Exam Vitals signs and nursing note reviewed.  Constitutional:      Appearance: He is well-developed.  HENT:     Head: Normocephalic and atraumatic.  Eyes:      Conjunctiva/sclera: Conjunctivae normal.  Neck:     Musculoskeletal: Neck supple.  Cardiovascular:     Rate and Rhythm: Normal rate and regular rhythm.     Heart sounds: No murmur.  Pulmonary:     Effort: Pulmonary effort is normal. No respiratory distress.     Breath sounds: Normal breath sounds.     Comments: Clear to auscultation bilaterally Abdominal:     Palpations: Abdomen is soft.     Tenderness: There is no abdominal tenderness.  Musculoskeletal: Normal range of motion.     Right lower leg: He exhibits no tenderness.     Left lower leg: He exhibits no tenderness.  Skin:    General: Skin is warm and dry.  Neurological:     Mental Status: He is alert and oriented to person, place, and time.  Psychiatric:        Mood and Affect: Mood normal.        Behavior: Behavior normal.      ED Treatments / Results  Labs (all labs ordered are listed, but only abnormal results are displayed) Labs Reviewed  BASIC METABOLIC PANEL - Abnormal; Notable for the following components:      Result Value   Sodium 130 (*)    Chloride 97 (*)    Glucose, Bld 109 (*)    Calcium 8.8 (*)    All other components within normal limits  CBC  D-DIMER, QUANTITATIVE (NOT AT [facility])  TROPONIN I (HIGH SENSITIVITY)  TROPONIN I (HIGH SENSITIVITY)    EKG EKG Interpretation  Date/Time:  Wednesday June 04 2019 02:21:34 EDT Ventricular Rate:  66 PR Interval:    QRS Duration: 98 QT Interval:  401 QTC Calculation: 421 R Axis:   83 Text Interpretation:  Sinus rhythm Consider right atrial enlargement Borderline right axis deviation No significant change since last tracing Confirmed by Ripley Fraise [phone]) on 06/04/2019 2:27:12 AM   Radiology Dg Chest 2 View  Result Date: 06/04/2019 CLINICAL DATA:  Chest pain and short of breath EXAM: CHEST - 2 VIEW COMPARISON:  06/01/2019 FINDINGS: Hyperinflation. No focal opacity or pleural effusion. Normal heart size. No pneumothorax. IMPRESSION: No active  cardiopulmonary disease. Electronically Signed   By: Donavan Foil M.D.   On: 06/04/2019 01:57    Procedures Procedures (including critical care time)  Medications Ordered in ED Medications  sodium chloride flush (NS) 0.9 % injection 3 mL (has no administration in time range)     Initial Impression / Assessment and Plan / ED Course  I have reviewed the triage vital signs and the nursing notes.  Pertinent labs & imaging results that were available during my care  of the patient were reviewed by me and considered in my medical decision making (see chart for details).        Patient with intermittent episodes of chest pain, lightheadedness, and shortness of breath for the past couple of days.  Initial high-sensitivity troponin is less than 2.  Laboratory work-up is reassuring.  Repeat HS trop pending.  D-dimer ordered.  CXR pending.  Chest x-ray is negative.  Repeat troponin is 3.  D-dimer is negative.  I have low suspicion for ACS.  Reassuring that repeat troponins have been unremarkable, EKG shows no ischemic changes.  Doubt PE given negative d-dimer, no tachycardia, no hypoxia.  Patient's symptoms could be GERD related.  Will try some omeprazole.    However, I have strongly encouraged patient to follow-up with cardiology.  I feel that patient can be safely discharged to home. Final Clinical Impressions(s) / ED Diagnoses   Final diagnoses:  Shortness of breath    ED Discharge Orders         Ordered    omeprazole (PRILOSEC) 20 MG capsule  Daily     06/04/19 0342           Montine Circle, PA-C 06/04/19 [id number]    Ripley Fraise, MD 06/04/19 2312

## 2019-07-20 ENCOUNTER — Other Ambulatory Visit: Payer: Self-pay | Admitting: Adult Health

## 2019-09-23 IMAGING — DX PORTABLE CHEST - 1 VIEW
1 series · 1 of 1 positions shown · non-contrast
Comparison: 01/06/2019 chest radiograph

CLINICAL DATA: Acute shortness of breath

EXAM:
PORTABLE CHEST 1 VIEW

[chest ap]
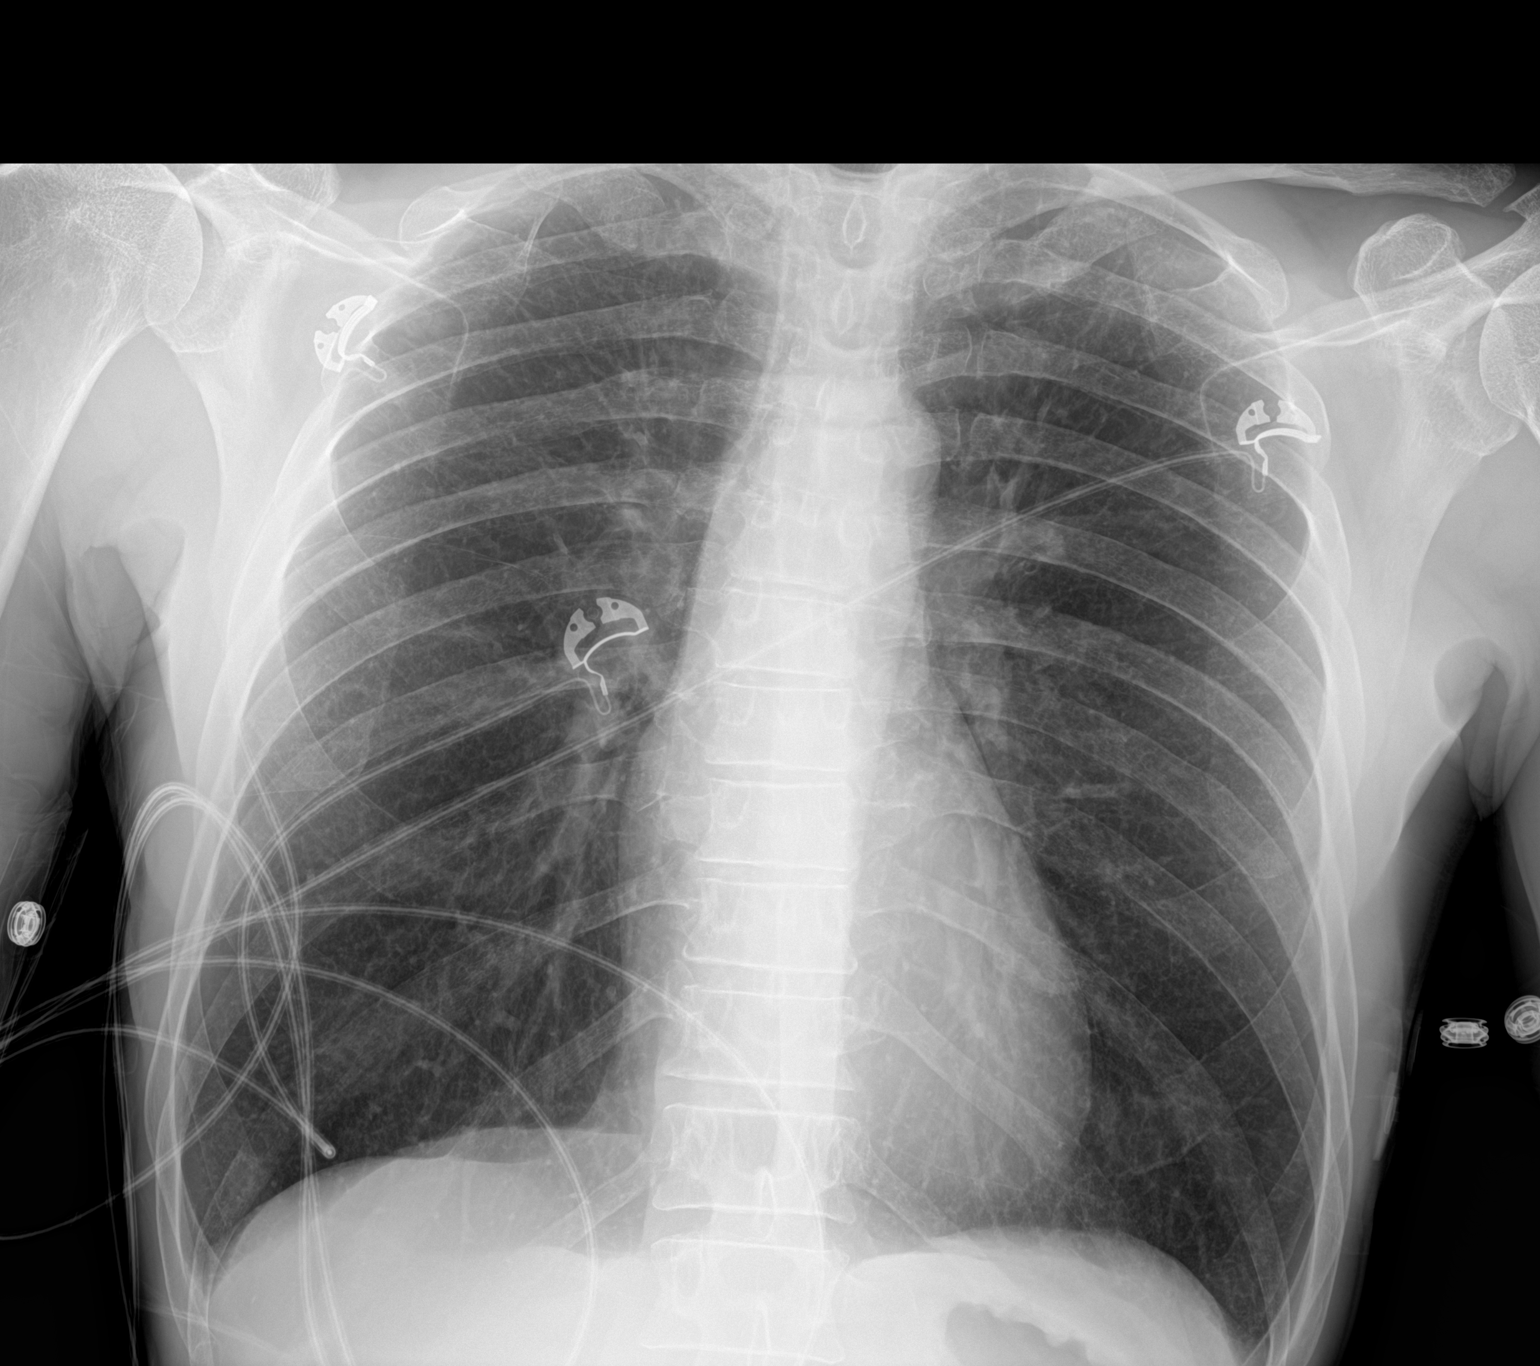

[1 of 1 positions shown; findings below may reference images not displayed]

FINDINGS: The cardiomediastinal silhouette is unremarkable.

There is no evidence of focal airspace disease, pulmonary edema,
suspicious pulmonary nodule/mass, pleural effusion, or pneumothorax.

No acute bony abnormalities are identified.
IMPRESSION: No active disease.

## 2019-09-26 IMAGING — DX CHEST - 2 VIEW
2 series · 2 of 2 positions shown · non-contrast
Comparison: 06/01/2019

CLINICAL DATA: Chest pain and short of breath

EXAM:
CHEST - 2 VIEW

[chest pa]
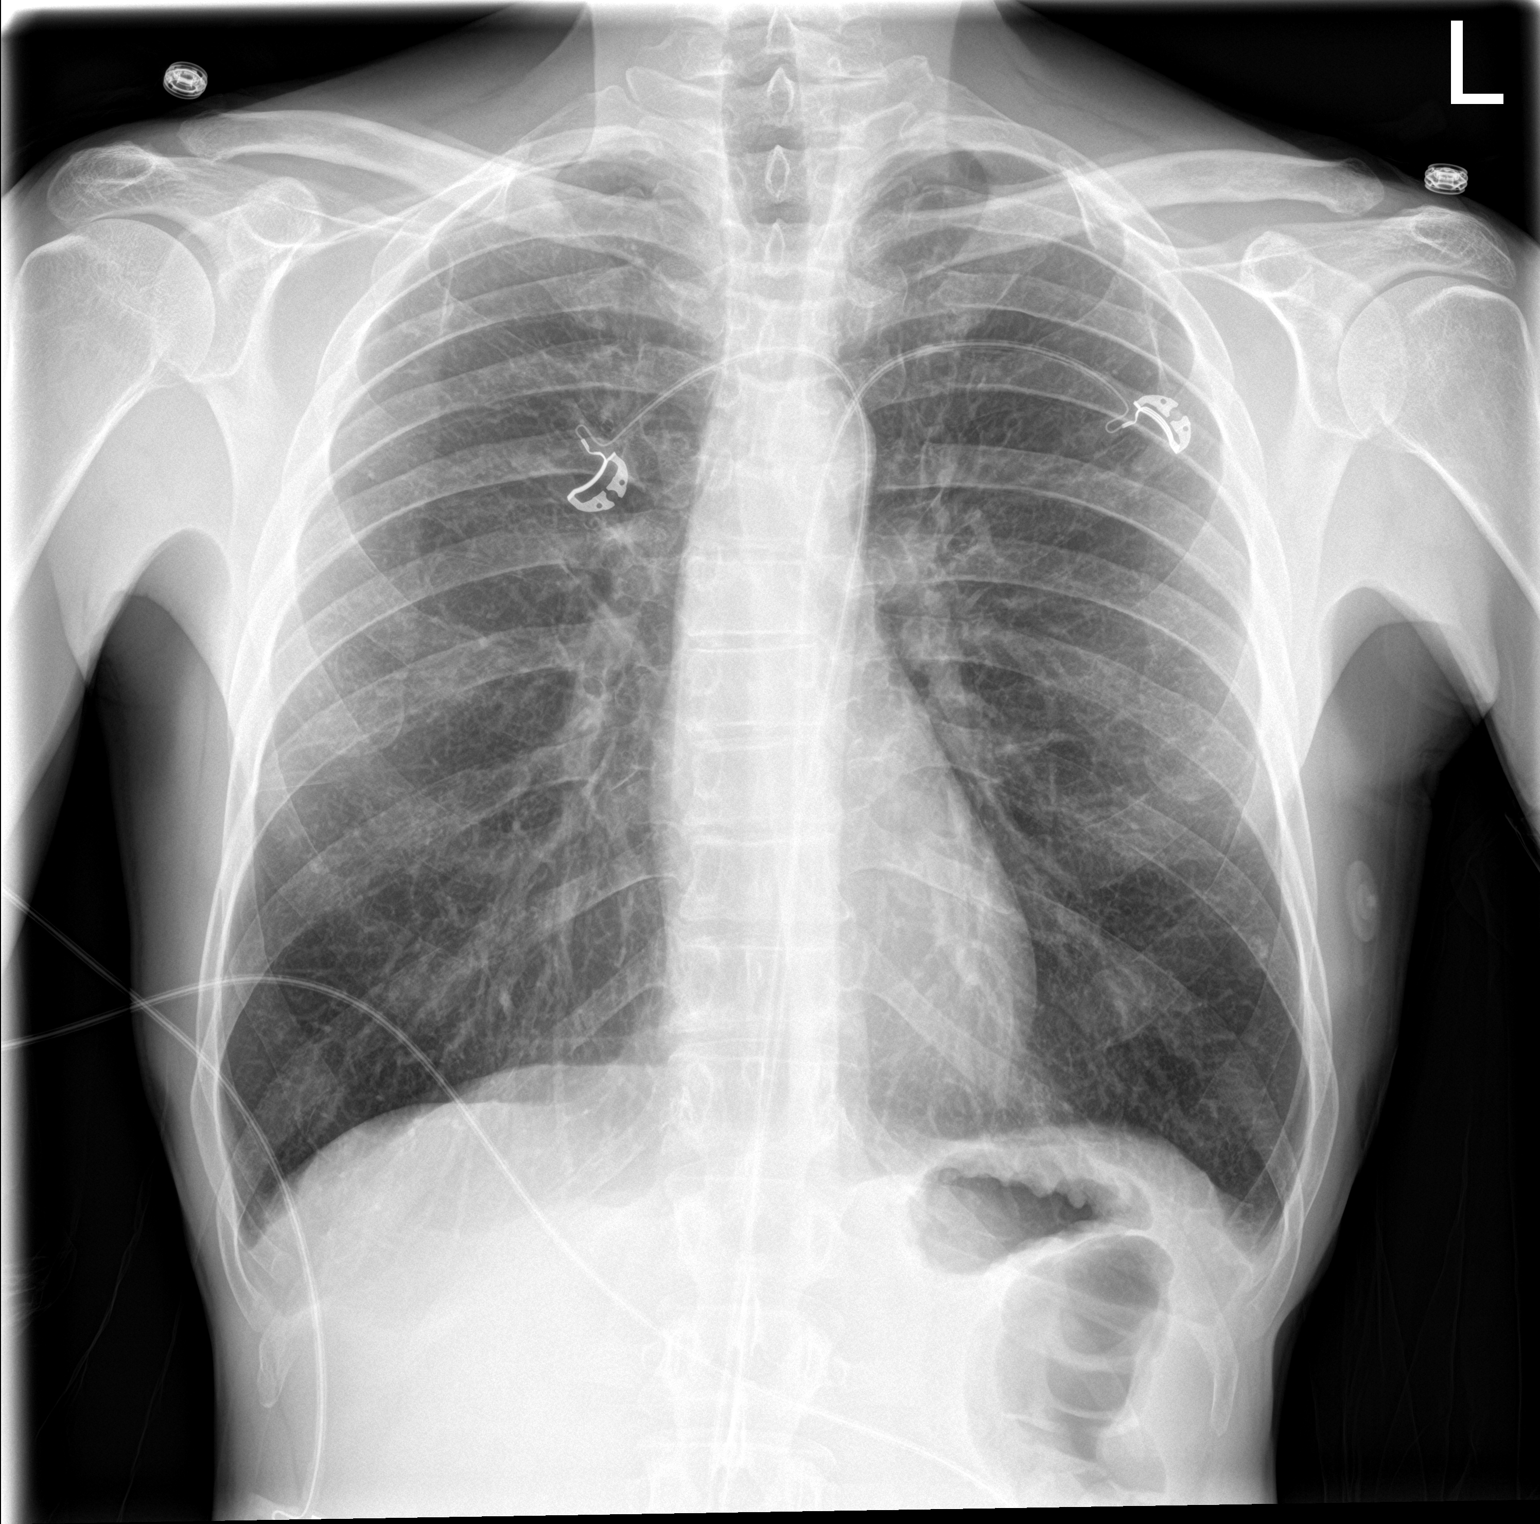

[chest lat]
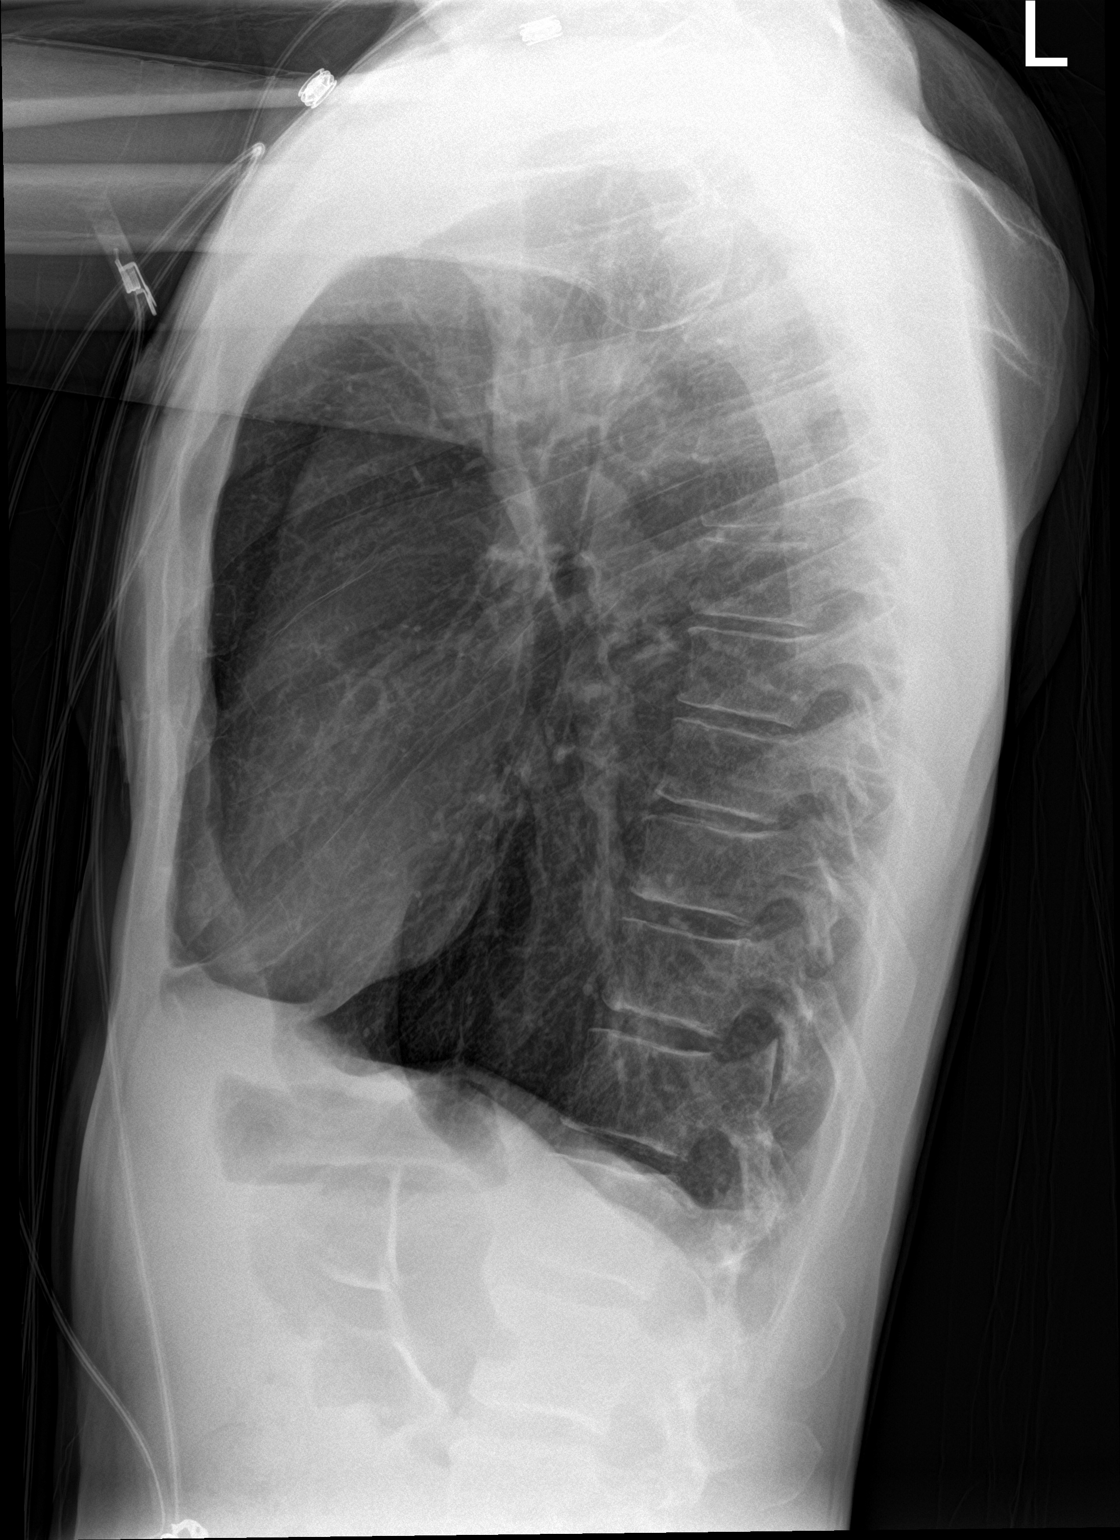

[2 of 2 positions shown; findings below may reference images not displayed]

FINDINGS: Hyperinflation. No focal opacity or pleural effusion. Normal heart
size. No pneumothorax.
IMPRESSION: No active cardiopulmonary disease.

## 2019-11-23 ENCOUNTER — Telehealth: Payer: Self-pay | Admitting: Student

## 2019-11-23 MED ORDER — OMEPRAZOLE 20 MG PO CPDR: 20.0000 mg | DELAYED_RELEASE_CAPSULE | Freq: Every day | ORAL | 1 refills | Status: DC

## 2019-11-23 NOTE — Telephone Encounter (Signed)
We can get him in this week.

## 2019-11-23 NOTE — Telephone Encounter (Addendum)
    Patient called the after hours line reporting a 2-week history of a burning sensation along his sternal region which can last for up to an hour and spontaneously resolves. Not associated with exertion or positional changes. No associated dyspnea, palpitations, nausea, vomiting or diaphoresis. Unclear if related to food consumption. He was previously prescribed Omeprazole but has not taken this in months per his report.   I recommended he restart Omeprazole and continue to monitor symptoms. Will send a message to the office to see if follow-up with Dr. Percival Spanish or an APP can be arranged this week for follow-up. We reviewed symptoms to monitor for and he is aware to proceed to the ED in the interim if symptoms progress or change in severity.   Signed, Erma Heritage, PA-C 11/23/2019, 4:10 PM Pager: [phone]

## 2019-11-25 NOTE — Telephone Encounter (Signed)
Patient scheduled for 1/22 with Dr. Percival Spanish.

## 2019-11-27 DIAGNOSIS — R072 Precordial pain: Secondary | ICD-10-CM | POA: Insufficient documentation

## 2019-11-27 DIAGNOSIS — Z7189 Other specified counseling: Secondary | ICD-10-CM | POA: Insufficient documentation

## 2019-11-27 NOTE — Progress Notes (Signed)
Cardiology Office Note   Date:  11/28/2019   ID:  Thomas Kelley, Thomas Kelley 06/19/1956, MRN [id number]  PCP:  Lorene Dy, MD  Cardiologist:   Minus Breeding, MD Referring:  Lorene Dy, MD  Chief Complaint  Patient presents with  . Chest Pain      History of Present Illness: Thomas Kelley is a 64 y.o. male who presents for follow up of NSTEMI with troponin elevation of 1.53 at peak. He subsequently underwent cardiac cath on 01/07/2019 which showed only 30% ostial-prox RCA, with no evidence of disease in the LM, LCx or LAD. 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 He was however, placed on DAPT with ASA and Plavix for at least 6 months.He was placed on amlodipine for anti-spasm properties. He was started on low dose Crestor 20 mg daily.  He called recently with recurrent chest pain.   He was feeling well up until a few weeks ago when he started having discomfort across his chest.  It happens every day since the 11th of this month.  Its a stinging discomfort.  It is across his chest.  His left arm might hurt.  He has some burning sensation.  He did take a sublingual nitroglycerin with improvement.  He is not able to bring this on.  He tries to be active and he cannot bring on the chest pressure, neck or arm discomfort.  There is no associated shortness of breath, PND or orthopnea.  He said no nausea vomiting or diaphoresis.  There have been no new palpitations tach presyncope or syncope   Past Medical History:  Diagnosis Date  . Elevated blood pressure reading without diagnosis of hypertension   . Hyperlipidemia    borderline  . NSTEMI (non-ST elevated myocardial infarction) ([place])    a. ? due to vasospasm - 12/2018 cath only 30% prox RCA.     Past Surgical History:  Procedure Laterality Date  . COLONOSCOPY    . LEFT HEART CATH AND CORONARY ANGIOGRAPHY N/A 01/07/2019   Procedure: LEFT HEART CATH AND CORONARY ANGIOGRAPHY;  Surgeon:  Burnell Blanks, MD;  Location: [facility];  Service: Cardiovascular;  Laterality: N/A;  . POLYPECTOMY    . TOOTH EXTRACTION     oral surgeon  . WISDOM TOOTH EXTRACTION  1982     Current Outpatient Medications  Medication Sig Dispense Refill  . ALPRAZolam (XANAX) 0.25 MG tablet Take 0.25 mg by mouth at bedtime as needed for anxiety.    Marland Kitchen amLODipine (NORVASC) 2.5 MG tablet TAKE 1 TABLET BY MOUTH EVERY DAY 90 tablet 1  . Ascorbic Acid (VITAMIN C) 1000 MG tablet Take 1,000 mg daily by mouth.    Marland Kitchen aspirin EC 81 MG EC tablet Take 1 tablet (81 mg total) by mouth daily. 30 tablet 6  . nitroGLYCERIN (NITROSTAT) 0.4 MG SL tablet Place 1 tablet (0.4 mg total) under the tongue every 5 (five) minutes as needed for chest pain (up to 3 doses). 25 tablet 3  . omeprazole (PRILOSEC) 20 MG capsule Take 1 capsule (20 mg total) by mouth daily. 30 capsule 1  . rosuvastatin (CRESTOR) 20 MG tablet TAKE 1 TABLET BY MOUTH EVERY DAY 90 tablet 1  . isosorbide mononitrate (IMDUR) 30 MG 24 hr tablet Take 1 tablet (30 mg total) by mouth daily. 90 tablet 3   No current facility-administered medications for this visit.    Allergies:   Patient  has no known allergies.     ROS:  Please see the history of present illness.   Otherwise, review of systems are positive for none.   All other systems are reviewed and negative.    PHYSICAL EXAM: VS:  BP 136/80 (BP Location: Right Arm, Patient Position: Sitting, Cuff Size: Normal)   Pulse 84   Temp (!) 97.1 F (36.2 C)   Wt 120 lb (54.4 kg)   BMI 19.37 kg/m  , BMI Body mass index is 19.37 kg/m. GENERAL:  Well appearing NECK:  No jugular venous distention, waveform within normal limits, carotid upstroke brisk and symmetric, no bruits, no thyromegaly LUNGS:  Clear to auscultation bilaterally CHEST:  Unremarkable HEART:  PMI not displaced or sustained,S1 and S2 within normal limits, no S3, no S4, no clicks, no rubs, no murmurs ABD:  Flat, positive bowel  sounds normal in frequency in pitch, no bruits, no rebound, no guarding, no midline pulsatile mass, no hepatomegaly, no splenomegaly EXT:  2 plus pulses throughout, no edema, no cyanosis no clubbing   EKG:  EKG is ordered today. The ekg ordered today demonstrates sinus rhythm, rate 84, axis within normal limits, intervals within normal limits, no acute ST-T wave changes.   Recent Labs: 03/05/2019: ALT 47 06/03/2019: BUN 10; Creatinine, Ser 0.82; Hemoglobin 14.3; Platelets 204; Potassium 3.9; Sodium 130    Lipid Panel    Component Value Date/Time   CHOL 134 03/05/2019 0808   TRIG 74 03/05/2019 0808   HDL 60 03/05/2019 0808   CHOLHDL 2.2 03/05/2019 0808   CHOLHDL 3.1 01/07/2019 0322   VLDL 9 01/07/2019 0322   LDLCALC 59 03/05/2019 0808      Wt Readings from Last 3 Encounters:  11/28/19 120 lb (54.4 kg)  04/15/19 112 lb (50.8 kg)  01/22/19 118 lb (53.5 kg)      Other studies Reviewed: Additional studies/ records that were reviewed today include: None. Review of the above records demonstrates:  Please see elsewhere in the note.     ASSESSMENT AND PLAN:  CHEST PAIN:    The patient chest pain is atypical.  He had nonobstructive coronary disease less than a year ago on cath.  He did have a mildly elevated troponin.  We are treating him as if he could have spasm although there is no proof of this.  I suspect a noncardiac etiology and refer him back to his primary care for possible referral to GI.  In the meantime it is reasonable to start Imdur 30 mg and to continue the sublingual as needed.  We can uptitrate the Imdur in the future.  DYSLIPIDEMIA: LDL was 59 with an HDL of 60.  No change in therapy.  COVID EDUCATION: We talked about the vaccine and he is anxious to get this.  Current medicines are reviewed at length with the patient today.  The patient does not have concerns regarding medicines.  The following changes have been made:  no change  Labs/ tests ordered today  include: None  Orders Placed This Encounter  Procedures  . EKG 12-Lead     Disposition:   FU with Jory Sims DNP in six weeks.     Signed, Minus Breeding, MD  11/28/2019 2:20 PM    [place]

## 2019-11-28 ENCOUNTER — Other Ambulatory Visit: Payer: Self-pay

## 2019-11-28 ENCOUNTER — Ambulatory Visit (INDEPENDENT_AMBULATORY_CARE_PROVIDER_SITE_OTHER): Payer: BC Managed Care – PPO | Admitting: Cardiology

## 2019-11-28 ENCOUNTER — Encounter: Payer: Self-pay | Admitting: Cardiology

## 2019-11-28 VITALS — BP 136/80 | HR 84 | Temp 97.1°F | Wt 120.0 lb

## 2019-11-28 DIAGNOSIS — E785 Hyperlipidemia, unspecified: Secondary | ICD-10-CM | POA: Diagnosis not present

## 2019-11-28 DIAGNOSIS — Z7189 Other specified counseling: Secondary | ICD-10-CM | POA: Diagnosis not present

## 2019-11-28 DIAGNOSIS — R072 Precordial pain: Secondary | ICD-10-CM

## 2019-11-28 MED ORDER — NITROGLYCERIN 0.4 MG SL SUBL: 0.4000 mg | SUBLINGUAL_TABLET | SUBLINGUAL | 3 refills | Status: DC | PRN

## 2019-11-28 MED ORDER — ISOSORBIDE MONONITRATE ER 30 MG PO TB24: 30.0000 mg | ORAL_TABLET | Freq: Every day | ORAL | 3 refills | Status: DC

## 2019-11-28 NOTE — Patient Instructions (Signed)
Medication Instructions:  Start IMDUR 30mg  daily *If you need a refill on your cardiac medications before your next appointment, please call your pharmacy*  Lab Work: None  Testing/Procedures: None  Follow-Up: At [facility], you and your health needs are our priority.  As part of our continuing mission to provide you with exceptional heart care, we have created designated Provider Care Teams.  These Care Teams include your primary Cardiologist (physician) and Advanced Practice Providers (APPs -  Physician Assistants and Nurse Practitioners) who all work together to provide you with the care you need, when you need it.  Your next appointment:   6 week(s)  The format for your next appointment:   In Person  Provider:   Jory Sims

## 2020-01-04 NOTE — Progress Notes (Addendum)
Cardiology Office Note  Date: 01/06/2020   ID: Thomas Kelley, DOB [birth year], MRN [id number]  PCP:  Lorene Dy, MD  Cardiologist:  Minus Breeding, MD Electrophysiologist:  None   Chief Complaint  Patient presents with  . Follow-up    Follow-up for atypical chest pains question vasospastic disease, GERD, hyperlipidemia    History of Present Illness: Thomas Kelley is a 64 y.o. male with a history of NSTEMI 12/2018. Cath; Only 30% prox RCA. GERD, HLD, CP.Question of vasospastic disease. Started on amlodipine.  ED visit 06/03/2019 w CP. Ruled out for ACS with Trop neg x 2 . D dimer Neg., CXR neg. Thought to be GERD related. Prescribed Omeprazole 20 mg daily. During that visit he c/o intermittent CP and SOB, feelings of being flushed and lightheaded.  Last seen by Dr. Percival Spanish on November 28, 2019 for follow-up of NSTEMI.  He called the prior week 11/23/2019 c/o 2-week history of a burning sensation along his sternal region which last for up to an hour and spontaneously resolved. Described as a stinging discomfort across his chest. Left arm might hurt. He was started on Imdur 30 mg daily.  He denies any recent issues with chest pain, burning sensations, palpitations, arrhythmias, orthostatic symptoms, stroke or TIA-like symptoms, dyspeptic symptoms, or blood in stool or urine.  Denies any claudication-like symptoms, DVT or PE-like symptoms, or lower extremity edema.  States he is taking the Prilosec daily without any issues.  Currently states he is not taking the Imdur and does not feel the need to at the moment.  Patient states he had an annual wellness visit with his primary care physician earlier this morning.   Past Medical History:  Diagnosis Date  . Elevated blood pressure reading without diagnosis of hypertension   . Hyperlipidemia    borderline  . NSTEMI (non-ST elevated myocardial infarction) ([place])    a. ? due to vasospasm - 12/2018 cath only 30% prox RCA.     Past Surgical  History:  Procedure Laterality Date  . COLONOSCOPY    . LEFT HEART CATH AND CORONARY ANGIOGRAPHY N/A 01/07/2019   Procedure: LEFT HEART CATH AND CORONARY ANGIOGRAPHY;  Surgeon: Burnell Blanks, MD;  Location: [facility];  Service: Cardiovascular;  Laterality: N/A;  . POLYPECTOMY    . TOOTH EXTRACTION     oral surgeon  . WISDOM TOOTH EXTRACTION  1982    Current Outpatient Medications  Medication Sig Dispense Refill  . ALPRAZolam (XANAX) 0.25 MG tablet Take 0.25 mg by mouth at bedtime as needed for anxiety.    Marland Kitchen amLODipine (NORVASC) 2.5 MG tablet TAKE 1 TABLET BY MOUTH EVERY DAY 90 tablet 1  . Ascorbic Acid (VITAMIN C) 1000 MG tablet Take 1,000 mg daily by mouth.    Marland Kitchen aspirin EC 81 MG EC tablet Take 1 tablet (81 mg total) by mouth daily. 30 tablet 6  . nitroGLYCERIN (NITROSTAT) 0.4 MG SL tablet Place 1 tablet (0.4 mg total) under the tongue every 5 (five) minutes as needed for chest pain (up to 3 doses). 25 tablet 3  . omeprazole (PRILOSEC) 20 MG capsule Take 1 capsule (20 mg total) by mouth daily. 30 capsule 1  . rosuvastatin (CRESTOR) 20 MG tablet TAKE 1 TABLET BY MOUTH EVERY DAY 90 tablet 1  . isosorbide mononitrate (IMDUR) 30 MG 24 hr tablet Take 1 tablet (30 mg total) by mouth daily. (Patient not taking: Reported on 01/05/2020) 90 tablet 3   No current facility-administered medications  for this visit.   Allergies:  Patient has no known allergies.   Social History: The patient  reports that he has never smoked. He has never used smokeless tobacco. He reports current alcohol use of about 4.0 standard drinks of alcohol per week. He reports that he does not use drugs.   Family History: The patient's family history includes Stomach cancer (age of onset: 40) in his father.   ROS:  Please see the history of present illness. Otherwise, complete review of systems is positive for none.  All other systems are reviewed and negative.   Physical Exam: VS:  BP 102/60 (BP Location:  Left Arm, Patient Position: Sitting, Cuff Size: Normal)   Pulse 76   Ht 5\' 6"  (1.676 m)   Wt 125 lb (56.7 kg)   BMI 20.18 kg/m , BMI Body mass index is 20.18 kg/m.  Wt Readings from Last 3 Encounters:  01/05/20 125 lb (56.7 kg)  11/28/19 120 lb (54.4 kg)  04/15/19 112 lb (50.8 kg)    General: Patient appears comfortable at rest. Neck: Supple, no elevated JVP or carotid bruits, no thyromegaly. Lungs: Clear to auscultation, nonlabored breathing at rest. Cardiac: Regular rate and rhythm, no S3 or significant systolic murmur, no pericardial rub. Extremities: No pitting edema, distal pulses 2+. Skin: Warm and dry. Musculoskeletal: No kyphosis. Neuropsychiatric: Alert and oriented x3, affect grossly appropriate.  ECG: None today  Recent Labwork: 03/05/2019: ALT 47; AST 36 06/03/2019: BUN 10; Creatinine, Ser 0.82; Hemoglobin 14.3; Platelets 204; Potassium 3.9; Sodium 130     Component Value Date/Time   CHOL 134 03/05/2019 0808   TRIG 74 03/05/2019 0808   HDL 60 03/05/2019 0808   CHOLHDL 2.2 03/05/2019 0808   CHOLHDL 3.1 01/07/2019 0322   VLDL 9 01/07/2019 0322   LDLCALC 59 03/05/2019 0808    Other Studies Reviewed Today:  Cardiac Catheterization 01/07/2019  Ost RCA to Prox RCA lesion is 30% stenosed.  1. No evidence of disease in the left main, Circumflex or LAD 2. The RCA is a large dominant artery. One view shows what appears to be non-obstructive plaque in the proximal vessel just beyond the ostium. There is no dampening of the catheter with engagement of the vessel. Good reflux back into the aorta. This stenosis appears to be mild and not obstructive.  3. No obvious culprits lesions are seen on angiography  Recommendations: No focal lesions that are felt to represent ulcerated plaques or significant obstruction. He was admitted with a classic story of unstable angina and had mild elevation of troponin. Follow up on echo. Consideration for coronary vasospasm.  Given elevation  of troponin, I think it is reasonable to treat with DAPT with ASA and Plavix for at least six months. Would continue high intensity statin and beta blocker. Ambulate and if subsequent chest pain, consider adding a long acting nitrate.   Echocardiogram 01/07/2019 IMPRESSIONS  1. The left ventricle has normal systolic function with an ejection  fraction of 60-65%. The cavity size was normal. Left ventricular diastolic  parameters were normal.  2. The right ventricle has normal systolic function. The cavity was  normal. There is no increase in right ventricular wall thickness.  3. The mitral valve is normal in structure.  4. The tricuspid valve is normal in structure.  5. The aortic valve is normal in structure.  6. The pulmonic valve was normal in structure.   Assessment and Plan:  1. Precordial chest pain   2. Dyslipidemia   3.  NSTEMI (non-ST elevated myocardial infarction) ([place])   4. Gastroesophageal reflux disease without esophagitis     1. Precordial chest pain He currently denies any precordial chest pain, burning sensations, with exertion or at rest.  He states since starting the omeprazole the symptoms have improved.  States he is currently not taking the Imdur and does not feel the need to at the moment.  Take sublingual nitroglycerin as needed for chest pain.  Advised him to keep the medicine at hand and use it if necessary if he has any onset of exertional pain.  2. Dyslipidemia Patient states since starting the Crestor his lipid levels have significantly improved.  Continue Crestor 20 mg daily.  3. NSTEMI (non-ST elevated myocardial infarction) [facility]) NSTEMI February 2020.  Cardiac catheterization showed ostium to the proximal RCA 30% stenosis.  There was thought to be some variant of chest pain which may have been vasospastic in origin.  He was placed on amlodipine 2.5 mg daily.  Continue amlodipine at current dosage.  4. Gastroesophageal reflux disease without  esophagitis Patient was thought to have some element of gastroesophageal reflux disease based on symptom expression of burning in his chest.  Omeprazole was started.  Patient states the episodes have resolved.  Continue omeprazole.  Medication Adjustments/Labs and Tests Ordered: Current medicines are reviewed at length with the patient today.  Concerns regarding medicines are outlined above.   Disposition :  Dr Percival Spanish  Signed, Levell July, NP 01/06/2020 8:38 PM    [facility]

## 2020-01-05 ENCOUNTER — Encounter: Payer: Self-pay | Admitting: Adult Health

## 2020-01-05 ENCOUNTER — Other Ambulatory Visit: Payer: Self-pay

## 2020-01-05 ENCOUNTER — Ambulatory Visit (INDEPENDENT_AMBULATORY_CARE_PROVIDER_SITE_OTHER): Payer: BC Managed Care – PPO | Admitting: Adult Health

## 2020-01-05 VITALS — BP 102/60 | HR 76 | Ht 66.0 in | Wt 125.0 lb

## 2020-01-05 DIAGNOSIS — K219 Gastro-esophageal reflux disease without esophagitis: Secondary | ICD-10-CM

## 2020-01-05 DIAGNOSIS — E785 Hyperlipidemia, unspecified: Secondary | ICD-10-CM | POA: Diagnosis not present

## 2020-01-05 DIAGNOSIS — R072 Precordial pain: Secondary | ICD-10-CM

## 2020-01-05 DIAGNOSIS — I214 Non-ST elevation (NSTEMI) myocardial infarction: Secondary | ICD-10-CM | POA: Diagnosis not present

## 2020-01-05 NOTE — Patient Instructions (Signed)
Medication Instructions:  Continue current medications  *If you need a refill on your cardiac medications before your next appointment, please call your pharmacy*   Lab Work: None Ordered  Testing/Procedures: None Ordered   Follow-Up: At [facility], you and your health needs are our priority.  As part of our continuing mission to provide you with exceptional heart care, we have created designated Provider Care Teams.  These Care Teams include your primary Cardiologist (physician) and Advanced Practice Providers (APPs -  Physician Assistants and Nurse Practitioners) who all work together to provide you with the care you need, when you need it.  We recommend signing up for the patient portal called "MyChart".  Sign up information is provided on this After Visit Summary.  MyChart is used to connect with patients for Virtual Visits (Telemedicine).  Patients are able to view lab/test results, encounter notes, upcoming appointments, etc.  Non-urgent messages can be sent to your provider as well.   To learn more about what you can do with MyChart, go to [email or web address].    Your next appointment:   1 year(s)  The format for your next appointment:   In Person  Provider:   Minus Breeding, MD

## 2020-01-12 ENCOUNTER — Other Ambulatory Visit: Payer: Self-pay | Admitting: Adult Health

## 2020-07-11 ENCOUNTER — Other Ambulatory Visit: Payer: Self-pay | Admitting: Adult Health

## 2021-01-04 ENCOUNTER — Other Ambulatory Visit: Payer: Self-pay | Admitting: Cardiology

## 2021-01-26 NOTE — Progress Notes (Unsigned)
Cardiology Office Note   Date:  01/27/2021   ID:  Thomas Kelley, DOB May 14, 1956, MRN [id number]  PCP:  Lorene Dy, MD  Cardiologist:   Minus Breeding, MD Referring:  Lorene Dy, MD  Chief Complaint  Patient presents with  . Follow-up  . Coronary Artery Disease      History of Present Illness: Thomas Kelley is a 65 y.o. male who presents for follow up of NSTEMI with troponin elevation of 1.53 at peak. He subsequently underwent cardiac cath on 01/07/2019 which showed only 30% ostial-prox RCA, with no evidence of disease in the LM, LCx or LAD. 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 He was however, placed on DAPT with ASA and Plavix for at least 6 months.He was placed on amlodipine for anti-spasm properties. He was started on low dose Crestor 20 mg daily.  He called last year with recurrent chest pain.  This improved with omeprazole.  He returns for follow-up. He has done very well. The patient denies any new symptoms such as chest discomfort, neck or arm discomfort. There has been no new shortness of breath, PND or orthopnea. There have been no reported palpitations, presyncope or syncope.    Past Medical History:  Diagnosis Date  . Elevated blood pressure reading without diagnosis of hypertension   . Hyperlipidemia    borderline  . NSTEMI (non-ST elevated myocardial infarction) ([place])    a. ? due to vasospasm - 12/2018 cath only 30% prox RCA.     Past Surgical History:  Procedure Laterality Date  . COLONOSCOPY    . LEFT HEART CATH AND CORONARY ANGIOGRAPHY N/A 01/07/2019   Procedure: LEFT HEART CATH AND CORONARY ANGIOGRAPHY;  Surgeon: Burnell Blanks, MD;  Location: [facility];  Service: Cardiovascular;  Laterality: N/A;  . POLYPECTOMY    . TOOTH EXTRACTION     oral surgeon  . WISDOM TOOTH EXTRACTION  1982     Current Outpatient Medications  Medication Sig Dispense Refill  . ALPRAZolam (XANAX) 0.25  MG tablet Take 0.25 mg by mouth at bedtime as needed for anxiety.    Marland Kitchen amLODipine (NORVASC) 2.5 MG tablet Take 1 tablet (2.5 mg total) by mouth daily. NEED OV. 90 tablet 0  . Ascorbic Acid (VITAMIN C) 1000 MG tablet Take 1,000 mg daily by mouth.    . rosuvastatin (CRESTOR) 20 MG tablet TAKE 1 TABLET BY MOUTH EVERY DAY 90 tablet 1  . nitroGLYCERIN (NITROSTAT) 0.4 MG SL tablet Place 1 tablet (0.4 mg total) under the tongue every 5 (five) minutes as needed for chest pain (up to 3 doses). 25 tablet 3   No current facility-administered medications for this visit.    Allergies:   Patient has no known allergies.     ROS:  Please see the history of present illness.   Otherwise, review of systems are positive for none.   All other systems are reviewed and negative.    PHYSICAL EXAM: VS:  BP 122/72   Pulse 86   Ht 5\' 6"  (1.676 m)   Wt 123 lb 3.2 oz (55.9 kg)   BMI 19.89 kg/m  , BMI Body mass index is 19.89 kg/m. GENERAL:  Well appearing NECK:  No jugular venous distention, waveform within normal limits, carotid upstroke brisk and symmetric, no bruits, no thyromegaly LUNGS:  Clear to auscultation bilaterally CHEST:  Unremarkable HEART:  PMI not displaced or sustained,S1 and S2 within normal  limits, no S3, no S4, no clicks, no rubs, no murmurs ABD:  Flat, positive bowel sounds normal in frequency in pitch, no bruits, no rebound, no guarding, no midline pulsatile mass, no hepatomegaly, no splenomegaly EXT:  2 plus pulses throughout, no edema, no cyanosis no clubbing   EKG:  EKG is  ordered today. The ekg ordered today demonstrates sinus rhythm, rate 86, axis within normal limits, intervals within normal limits, no acute ST-T wave changes.   Recent Labs: No results found for requested labs within last 8760 hours.    Lipid Panel    Component Value Date/Time   CHOL 134 03/05/2019 0808   TRIG 74 03/05/2019 0808   HDL 60 03/05/2019 0808   CHOLHDL 2.2 03/05/2019 0808   CHOLHDL 3.1  01/07/2019 0322   VLDL 9 01/07/2019 0322   LDLCALC 59 03/05/2019 0808      Wt Readings from Last 3 Encounters:  01/27/21 123 lb 3.2 oz (55.9 kg)  01/05/20 125 lb (56.7 kg)  11/28/19 120 lb (54.4 kg)      Other studies Reviewed: Additional studies/ records that were reviewed today include: None. Review of the above records demonstrates: NA   ASSESSMENT AND PLAN:  CHEST PAIN:   He had no further chest pain.  He never took the Imdur that was prescribed because he was not having any further pain.  He is not taken any nitroglycerin.  No change in therapy or further evaluation.  He can continue on the Norvasc possible coronary vasospasm and continue with primary risk reduction for management of his very mild RCA stenosis.  DYSLIPIDEMIA: 2 years ago when I had his most recent lipids his total was 134.  He says that even better now.  He tolerates a statin and will remain on this given his known nonobstructive disease.    Current medicines are reviewed at length with the patient today.  The patient does not have concerns regarding medicines.  The following changes have been made:  None  Labs/ tests ordered today include: None  Orders Placed This Encounter  Procedures  . EKG 12-Lead     Disposition:   FU with me as needed.    Signed, Minus Breeding, MD  01/27/2021 8:07 AM    [facility]

## 2021-01-27 ENCOUNTER — Encounter: Payer: Self-pay | Admitting: Cardiology

## 2021-01-27 ENCOUNTER — Ambulatory Visit (INDEPENDENT_AMBULATORY_CARE_PROVIDER_SITE_OTHER): Payer: 59 | Admitting: Cardiology

## 2021-01-27 ENCOUNTER — Other Ambulatory Visit: Payer: Self-pay

## 2021-01-27 VITALS — BP 122/72 | HR 86 | Ht 66.0 in | Wt 123.2 lb

## 2021-01-27 DIAGNOSIS — E785 Hyperlipidemia, unspecified: Secondary | ICD-10-CM

## 2021-01-27 DIAGNOSIS — R072 Precordial pain: Secondary | ICD-10-CM | POA: Diagnosis not present

## 2021-01-27 NOTE — Patient Instructions (Signed)
Medication Instructions:  Your physician recommends that you continue on your current medications as directed. Please refer to the Current Medication list given to you today.  *If you need a refill on your cardiac medications before your next appointment, please call your pharmacy*  Lab Work: NONE ordered at this time of appointment   If you have labs (blood work) drawn today and your tests are completely normal, you will receive your results only by: Marland Kitchen MyChart Message (if you have MyChart) OR . A paper copy in the mail If you have any lab test that is abnormal or we need to change your treatment, we will call you to review the results.  Testing/Procedures: NONE ordered at this time of appointment   Follow-Up: At [facility], you and your health needs are our priority.  As part of our continuing mission to provide you with exceptional heart care, we have created designated Provider Care Teams.  These Care Teams include your primary Cardiologist (physician) and Advanced Practice Providers (APPs -  Physician Assistants and Nurse Practitioners) who all work together to provide you with the care you need, when you need it.  Your next appointment:   Follow up as needed    The format for your next appointment:   In Person  Provider:   You may see Minus Breeding, MD or one of the following Advanced Practice Providers on your designated Care Team:    Rosaria Ferries, PA-C  Jory Sims, DNP, ANP   Other Instructions

## 2021-05-31 ENCOUNTER — Other Ambulatory Visit: Payer: Self-pay | Admitting: Cardiology

## 2022-01-17 ENCOUNTER — Other Ambulatory Visit: Payer: Self-pay | Admitting: Internal Medicine

## 2022-01-17 ENCOUNTER — Ambulatory Visit
Admission: RE | Admit: 2022-01-17 | Discharge: 2022-01-17 | Disposition: A | Discharge status: Home / Self Care | Payer: Medicare HMO | Source: Ambulatory Visit | Attending: Internal Medicine | Admitting: Internal Medicine

## 2022-01-17 DIAGNOSIS — M5489 Other dorsalgia: Secondary | ICD-10-CM

## 2024-09-10 ENCOUNTER — Ambulatory Visit
Admission: RE | Admit: 2024-09-10 | Discharge: 2024-09-10 | Disposition: A | Source: Ambulatory Visit | Attending: Internal Medicine | Admitting: Internal Medicine

## 2024-09-10 ENCOUNTER — Other Ambulatory Visit: Payer: Self-pay | Admitting: Internal Medicine

## 2024-09-10 DIAGNOSIS — M545 Low back pain, unspecified: Secondary | ICD-10-CM
